# Patient Record
Sex: Male | Born: 1950 | Race: White | Hispanic: No | Marital: Married | Smoking: Former smoker
Health system: Southern US, Community
[De-identification: ages and names within clinical notes are randomized; demographics above are authoritative.]

## PROBLEM LIST (undated history)

## (undated) DIAGNOSIS — K5900 Constipation, unspecified: Secondary | ICD-10-CM

## (undated) DIAGNOSIS — A6920 Lyme disease, unspecified: Secondary | ICD-10-CM

## (undated) DIAGNOSIS — I10 Essential (primary) hypertension: Secondary | ICD-10-CM

## (undated) DIAGNOSIS — E039 Hypothyroidism, unspecified: Secondary | ICD-10-CM

## (undated) DIAGNOSIS — H9190 Unspecified hearing loss, unspecified ear: Secondary | ICD-10-CM

## (undated) DIAGNOSIS — M47816 Spondylosis without myelopathy or radiculopathy, lumbar region: Secondary | ICD-10-CM

## (undated) DIAGNOSIS — I4891 Unspecified atrial fibrillation: Secondary | ICD-10-CM

## (undated) DIAGNOSIS — G039 Meningitis, unspecified: Secondary | ICD-10-CM

## (undated) DIAGNOSIS — E785 Hyperlipidemia, unspecified: Secondary | ICD-10-CM

## (undated) DIAGNOSIS — G459 Transient cerebral ischemic attack, unspecified: Secondary | ICD-10-CM

## (undated) DIAGNOSIS — K219 Gastro-esophageal reflux disease without esophagitis: Secondary | ICD-10-CM

## (undated) DIAGNOSIS — C801 Malignant (primary) neoplasm, unspecified: Secondary | ICD-10-CM

## (undated) HISTORY — PX: COLONOSCOPY W/ BIOPSIES AND POLYPECTOMY: SHX1376

## (undated) HISTORY — PX: JOINT REPLACEMENT: SHX530

---

## 2018-08-16 ENCOUNTER — Other Ambulatory Visit: Payer: Self-pay

## 2018-08-16 ENCOUNTER — Encounter (HOSPITAL_COMMUNITY): Payer: Self-pay | Admitting: Emergency Medicine

## 2018-08-16 ENCOUNTER — Ambulatory Visit (HOSPITAL_COMMUNITY)
Admission: EM | Admit: 2018-08-16 | Discharge: 2018-08-16 | Disposition: A | Discharge status: Home / Self Care | Payer: Medicare PPO | Attending: Family Medicine | Admitting: Family Medicine

## 2018-08-16 DIAGNOSIS — J0100 Acute maxillary sinusitis, unspecified: Secondary | ICD-10-CM | POA: Insufficient documentation

## 2018-08-16 DIAGNOSIS — H04301 Unspecified dacryocystitis of right lacrimal passage: Secondary | ICD-10-CM | POA: Insufficient documentation

## 2018-08-16 HISTORY — DX: Meningitis, unspecified: G03.9

## 2018-08-16 HISTORY — DX: Lyme disease, unspecified: A69.20

## 2018-08-16 HISTORY — DX: Hyperlipidemia, unspecified: E78.5

## 2018-08-16 HISTORY — DX: Essential (primary) hypertension: I10

## 2018-08-16 MED ORDER — DOXYCYCLINE HYCLATE 100 MG PO CAPS: 100.0000 mg | ORAL_CAPSULE | Freq: Two times a day (BID) | ORAL | 0 refills | Status: DC

## 2018-08-16 MED ORDER — FLUTICASONE PROPIONATE 50 MCG/ACT NA SUSP: 2.0000 | Freq: Every day | NASAL | 0 refills | Status: DC

## 2018-08-16 MED ORDER — CETIRIZINE HCL 5 MG PO CHEW: 5.0000 mg | CHEWABLE_TABLET | Freq: Every day | ORAL | 0 refills | Status: DC

## 2018-08-16 NOTE — Discharge Instructions (Signed)
Get plenty of rest and push fluids Zyrtec prescribed for nasal congestion, runny nose, and/or sore throat Flonase prescribed for nasal congestion and runny nose Use medications daily for symptom relief Augmentin for possible sinus infection Use OTC medications like ibuprofen or tylenol as needed fever or pain Follow up with PCP or [facility] if symptoms persist Return or go to ER if you have any new or worsening symptoms fever, chills, nausea, vomiting, chest pain, cough, shortness of breath, wheezing, abdominal pain, changes in bowel or bladder habits, etc...  Eye drainage: Eye drainage most likely secondary to blocked tear duct Apply warm compresses to eye for 10-15 minutes a day approximately 4 times Also try to gently massage the tear duct throughout the day Return or follow up with eye doctor if symptoms persists despite treatment Return or go to the ED if you have any new or worsening symptoms such as pain, itching, increased redness, swelling, vision changes, etc..Marland Kitchen

## 2018-08-16 NOTE — ED Provider Notes (Signed)
[place]   [id number] 08/16/18 Arrival Time: 1448   CC: URI symptoms and eye drainage  SUBJECTIVE: History from: patient.  Alvin Chase is a 68 y.o. male who presents with nasal congestion, sinus pain/ pressure, sore throat, PND, and cough x 6 days.  Wife with similar symptoms.  Symptoms began after painting bedrooms.  Has tried delsym, aspirin, and benadryl with temporary relief.  Symptoms are made worse at night.  Denies similar symptoms in the past.  Complains of feeling hot/ cold, and fatigue.  Denies SOB, wheezing, chest pain, nausea, changes in bowel or bladder habits.    Patient also mentions RT eye drainage x 2 days.  Denies precipitating event or positive exposure to bacterial conjunctivitis.  Tried rinsing eye out without relief.  Worse in the AM.  Denies pain or itching.  Denis vision changes, FB sensation, or contact lens use.    Received flu shot this year: no.  ROS: As per HPI.  Past Medical History:  Diagnosis Date  . Hyperlipemia   . Hypertension   . Lyme disease   . Meningitis    Past Surgical History:  Procedure Laterality Date  . JOINT REPLACEMENT     Allergies  Allergen Reactions  . Amoxicillin Rash  . Plaquenil  [Hydroxychloroquine Sulfate] Rash   No current facility-administered medications on file prior to encounter.    Current Outpatient Medications on File Prior to Encounter  Medication Sig Dispense Refill  . gabapentin (NEURONTIN) 300 MG capsule gabapentin 300 mg capsule  Take 1 capsule 3 times a day by oral route.    Marland Kitchen levothyroxine (SYNTHROID, LEVOTHROID) 100 MCG tablet levothyroxine 100 mcg tablet  TAKE 1 TABLET BY MOUTH EVERY DAY    . simvastatin (ZOCOR) 20 MG tablet Take by mouth.    Marland Kitchen lisinopril (PRINIVIL,ZESTRIL) 5 MG tablet lisinopril 5 mg tablet     Social History   Socioeconomic History  . Marital status: Married    Spouse name: Not on file  . Number of children: Not on file  . Years of education: Not on file  .  Highest education level: Not on file  Occupational History  . Not on file  Social Needs  . Financial resource strain: Not on file  . Food insecurity:    Worry: Not on file    Inability: Not on file  . Transportation needs:    Medical: Not on file    Non-medical: Not on file  Tobacco Use  . Smoking status: Never Smoker  . Smokeless tobacco: Never Used  Substance and Sexual Activity  . Alcohol use: Yes    Frequency: Never  . Drug use: Never  . Sexual activity: Not on file  Lifestyle  . Physical activity:    Days per week: Not on file    Minutes per session: Not on file  . Stress: Not on file  Relationships  . Social connections:    Talks on phone: Not on file    Gets together: Not on file    Attends religious service: Not on file    Active member of club or organization: Not on file    Attends meetings of clubs or organizations: Not on file    Relationship status: Not on file  . Intimate partner violence:    Fear of current or ex partner: Not on file    Emotionally abused: Not on file    Physically abused: Not on file    Forced sexual activity: Not on file  Other Topics Concern  . Not on file  Social History Narrative  . Not on file   History reviewed. No pertinent family history.  OBJECTIVE:  Vitals:   08/16/18 1124  BP: 139/74  Pulse: 88  Temp: 99.2 F (37.3 C)  TempSrc: Temporal  SpO2: 99%     General appearance: alert; appears fatigued, but nontoxic; speaking in full sentences and tolerating own secretions HEENT: NCAT; Ears: EACs clear, TMs pearly gray; Eyes: PERRL.  EOM grossly intact. RT conjunctiva mildly injected, with purulent drainage from RT eye; mild surrounding periorbital swelling without erythema, NTTP (see pictures below); Sinuses: TTP; Nose: nares patent with mild rhinorrhea, Throat: oropharynx clear, tonsils non erythematous or enlarged, uvula midline  Neck: supple without LAD Lungs: unlabored respirations, symmetrical air entry; cough:  moderate; no respiratory distress; CTAB Heart: regular rate and rhythm.  Radial pulses 2+ symmetrical bilaterally Skin: warm and dry Psychological: alert and cooperative; normal mood and affect        ASSESSMENT & PLAN:  1. Acute non-recurrent maxillary sinusitis   2. Tear duct infection, right     Meds ordered this encounter  Medications  . fluticasone (FLONASE) 50 MCG/ACT nasal spray    Sig: Place 2 sprays into both nostrils daily.    Dispense:  16 g    Refill:  0    Order Specific Question:   Supervising Provider    Answer:   Raylene Everts [[id number]]  . doxycycline (VIBRAMYCIN) 100 MG capsule    Sig: Take 1 capsule (100 mg total) by mouth 2 (two) times daily.    Dispense:  20 capsule    Refill:  0    Order Specific Question:   Supervising Provider    Answer:   Raylene Everts [[id number]]  . cetirizine (ZYRTEC) 5 MG chewable tablet    Sig: Chew 1 tablet (5 mg total) by mouth daily.    Dispense:  20 tablet    Refill:  0    Order Specific Question:   Supervising Provider    Answer:   Raylene Everts [[id number]]    Get plenty of rest and push fluids Zyrtec prescribed for nasal congestion, runny nose, and/or sore throat Flonase prescribed for nasal congestion and runny nose Use medications daily for symptom relief Doxycycline for possible sinus infection Use OTC medications like ibuprofen or tylenol as needed fever or pain Follow up with PCP or [facility] if symptoms persist Return or go to ER if you have any new or worsening symptoms fever, chills, nausea, vomiting, chest pain, cough, shortness of breath, wheezing, abdominal pain, changes in bowel or bladder habits, etc...  Eye drainage: Eye drainage most likely secondary to blocked tear duct Apply warm compresses to eye for 10-15 minutes a day approximately 4 times Also try to gently massage the tear duct throughout the day Return or follow up with eye doctor if symptoms persists despite  treatment Return or go to the ED if you have any new or worsening symptoms such as pain, itching, increased redness, swelling, vision changes, etc...  Reviewed expectations re: course of current medical issues. Questions answered. Outlined signs and symptoms indicating need for more acute intervention. Patient verbalized understanding. After Visit Summary given.         Lestine Box, PA-C 08/16/18 1251

## 2018-08-16 NOTE — ED Notes (Signed)
Spoke to dr hagler, agreeable to seeing patient and spouse in room.  Spouse reports patient is hard of hearing.

## 2018-08-16 NOTE — ED Triage Notes (Addendum)
Pt reports a cough and PND that started last Thursday night.  Pt woke up this morning with right eye drainage.  He is also having some tightness and SOB in his chest from the cough.  Pt is here with his wife who is having similar symptoms.  She reports they all started after they painted the ceiling in their new house last Thursday.

## 2019-11-16 ENCOUNTER — Other Ambulatory Visit: Payer: Self-pay | Admitting: Neurosurgery

## 2019-11-21 ENCOUNTER — Other Ambulatory Visit: Payer: Self-pay | Admitting: Neurosurgery

## 2019-11-23 NOTE — Pre-Procedure Instructions (Addendum)
   COHEN NOVACEK  11/23/2019     Your procedure is scheduled on Thursday, November 29, 2019  Report to [facility] Admitting at 10:55 A.M.  Call this number if you have problems the morning of surgery:  [phone]   Remember:  Do not eat or drink after midnight the night before surgery.    Take these medicines the morning of surgery with A SIP OF WATER :  levothyroxine (SYNTHROID, LEVOTHROID)  gabapentin (NEURONTIN) If needed: pain medication Tylenol  ( OR ) oxyCODONE-acetaminophen (PERCOCET)  Stop taking Aspirin (unless otherwise advised by surgeon), vitamins, fish oil, and herbal medications. Do not take any NSAIDs ie: Ibuprofen, Advil, Naproxen (Aleve), Motrin, BC and Goody Powder; stop now.   Do not wear jewelry  Do not wear lotions, powders, or perfumes, or deodorant.  Do not shave 48 hours prior to surgery.  Men may shave face and neck.  Do not bring valuables to the hospital.  [facility] is not responsible for any belongings or valuables.  Contacts, dentures or bridgework may not be worn into surgery.  Leave your suitcase in the car.  After surgery it may be brought to your room. For patients admitted to the hospital, discharge time will be determined by your treatment team.  Patients discharged the day of surgery will not be allowed to drive home.   Special instructions: See " [facility] Preparing For Surgery " sheet.   Please read over the following fact sheets that you were given. Pain Booklet, Coughing and Deep Breathing and Surgical Site Infection Prevention

## 2019-11-26 ENCOUNTER — Encounter (HOSPITAL_COMMUNITY)
Admission: RE | Admit: 2019-11-26 | Discharge: 2019-11-26 | Disposition: A | Discharge status: Home / Self Care | Payer: Medicare PPO | Source: Ambulatory Visit | Attending: Neurosurgery | Admitting: Neurosurgery

## 2019-11-26 ENCOUNTER — Encounter (HOSPITAL_COMMUNITY): Payer: Self-pay

## 2019-11-26 ENCOUNTER — Other Ambulatory Visit: Payer: Self-pay

## 2019-11-26 ENCOUNTER — Other Ambulatory Visit (HOSPITAL_COMMUNITY)
Admission: RE | Admit: 2019-11-26 | Discharge: 2019-11-26 | Disposition: A | Discharge status: Home / Self Care | Payer: Medicare PPO | Source: Ambulatory Visit | Attending: Neurosurgery | Admitting: Neurosurgery

## 2019-11-26 DIAGNOSIS — I10 Essential (primary) hypertension: Secondary | ICD-10-CM | POA: Insufficient documentation

## 2019-11-26 DIAGNOSIS — Z01818 Encounter for other preprocedural examination: Secondary | ICD-10-CM | POA: Insufficient documentation

## 2019-11-26 HISTORY — DX: Unspecified atrial fibrillation: I48.91

## 2019-11-26 HISTORY — DX: Unspecified hearing loss, unspecified ear: H91.90

## 2019-11-26 HISTORY — DX: Hypothyroidism, unspecified: E03.9

## 2019-11-26 HISTORY — DX: Malignant (primary) neoplasm, unspecified: C80.1

## 2019-11-26 HISTORY — DX: Transient cerebral ischemic attack, unspecified: G45.9

## 2019-11-26 HISTORY — DX: Constipation, unspecified: K59.00

## 2019-11-26 HISTORY — DX: Spondylosis without myelopathy or radiculopathy, lumbar region: M47.816

## 2019-11-26 HISTORY — DX: Gastro-esophageal reflux disease without esophagitis: K21.9

## 2019-11-26 LAB — CBC
HCT: 45.3 % (ref 39.0–52.0)
Hemoglobin: 14.9 g/dL (ref 13.0–17.0)
MCH: 30.7 pg (ref 26.0–34.0)
MCHC: 32.9 g/dL (ref 30.0–36.0)
MCV: 93.4 fL (ref 80.0–100.0)
Platelets: 246 10*3/uL (ref 150–400)
RBC: 4.85 MIL/uL (ref 4.22–5.81)
RDW: 14 % (ref 11.5–15.5)
WBC: 5.6 10*3/uL (ref 4.0–10.5)
nRBC: 0 % (ref 0.0–0.2)

## 2019-11-26 LAB — SURGICAL PCR SCREEN
MRSA, PCR: NEGATIVE
Staphylococcus aureus: NEGATIVE

## 2019-11-26 LAB — BASIC METABOLIC PANEL
Anion gap: 9 (ref 5–15)
BUN: 19 mg/dL (ref 8–23)
CO2: 25 mmol/L (ref 22–32)
Calcium: 8.5 mg/dL — ABNORMAL LOW (ref 8.9–10.3)
Chloride: 104 mmol/L (ref 98–111)
Creatinine, Ser: 1.11 mg/dL (ref 0.61–1.24)
GFR calc Af Amer: >60 mL/min (ref >60)
GFR calc non Af Amer: >60 mL/min (ref >60)
Glucose, Bld: 106 mg/dL — ABNORMAL HIGH (ref 70–99)
Potassium: 4.2 mmol/L (ref 3.5–5.1)
Sodium: 138 mmol/L (ref 135–145)

## 2019-11-26 LAB — TYPE AND SCREEN
ABO/RH(D): A POS
Antibody Screen: NEGATIVE

## 2019-11-26 LAB — SARS CORONAVIRUS 2 (TAT 6-24 HRS): SARS Coronavirus 2: NEGATIVE

## 2019-11-26 LAB — ABO/RH: ABO/RH(D): A POS

## 2019-11-26 NOTE — Progress Notes (Signed)
   11/26/19 1130  OBSTRUCTIVE SLEEP APNEA  Have you ever been diagnosed with sleep apnea through a sleep study? No  Do you snore loudly (loud enough to be heard through closed doors)?  0  Do you often feel tired, fatigued, or sleepy during the daytime (such as falling asleep during driving or talking to someone)? 0  Has anyone observed you stop breathing during your sleep? 1  Do you have, or are you being treated for high blood pressure? 1  BMI more than 35 kg/m2? 0  Age > 50 (1-yes) 1  Neck circumference greater than:Male 16 inches or larger, Male 17inches or larger? 1  Male Gender (Yes=1) 1  Obstructive Sleep Apnea Score 5

## 2019-11-26 NOTE — Progress Notes (Signed)
Pt HOH and does not wear hearing aids. Pt accompanied to PAT appointment with spouse, Katrina. Pt denies SOB, chest pain, and being under the care of a cardiologist. Pt PCP is Dr. Elvina Mattes. Pt denies having a stress test, echo and cardiac cath. Pt denies having an EKG and chest x ray in the last year. Pt denies recent labs. Spouse reminded to have pt quarantine. Spouse verbalized understanding of all pre-op instructions.

## 2019-11-29 ENCOUNTER — Inpatient Hospital Stay (HOSPITAL_COMMUNITY): Payer: Medicare PPO

## 2019-11-29 ENCOUNTER — Encounter (HOSPITAL_COMMUNITY): Payer: Self-pay | Admitting: Neurosurgery

## 2019-11-29 ENCOUNTER — Encounter (HOSPITAL_COMMUNITY)
Admission: RE | Disposition: A | Discharge status: Home / Self Care | Payer: Self-pay | Source: Home / Self Care | Attending: Neurosurgery

## 2019-11-29 ENCOUNTER — Inpatient Hospital Stay (HOSPITAL_COMMUNITY): Payer: Medicare PPO | Admitting: Anesthesiology

## 2019-11-29 ENCOUNTER — Other Ambulatory Visit: Payer: Self-pay

## 2019-11-29 ENCOUNTER — Inpatient Hospital Stay (HOSPITAL_COMMUNITY)
Admission: RE | Admit: 2019-11-29 | Discharge: 2019-11-30 | DRG: 455 | Disposition: A | Discharge status: Home / Self Care | Payer: Medicare PPO | Attending: Neurosurgery | Admitting: Neurosurgery

## 2019-11-29 DIAGNOSIS — M5116 Intervertebral disc disorders with radiculopathy, lumbar region: Secondary | ICD-10-CM | POA: Diagnosis present

## 2019-11-29 DIAGNOSIS — Z79899 Other long term (current) drug therapy: Secondary | ICD-10-CM

## 2019-11-29 DIAGNOSIS — Z881 Allergy status to other antibiotic agents status: Secondary | ICD-10-CM | POA: Diagnosis not present

## 2019-11-29 DIAGNOSIS — Z8673 Personal history of transient ischemic attack (TIA), and cerebral infarction without residual deficits: Secondary | ICD-10-CM | POA: Diagnosis not present

## 2019-11-29 DIAGNOSIS — E785 Hyperlipidemia, unspecified: Secondary | ICD-10-CM | POA: Diagnosis present

## 2019-11-29 DIAGNOSIS — Z419 Encounter for procedure for purposes other than remedying health state, unspecified: Secondary | ICD-10-CM

## 2019-11-29 DIAGNOSIS — Z79890 Hormone replacement therapy (postmenopausal): Secondary | ICD-10-CM | POA: Diagnosis not present

## 2019-11-29 DIAGNOSIS — Z888 Allergy status to other drugs, medicaments and biological substances status: Secondary | ICD-10-CM

## 2019-11-29 DIAGNOSIS — K219 Gastro-esophageal reflux disease without esophagitis: Secondary | ICD-10-CM | POA: Diagnosis present

## 2019-11-29 DIAGNOSIS — Z885 Allergy status to narcotic agent status: Secondary | ICD-10-CM | POA: Diagnosis not present

## 2019-11-29 DIAGNOSIS — I10 Essential (primary) hypertension: Secondary | ICD-10-CM | POA: Diagnosis present

## 2019-11-29 DIAGNOSIS — M4316 Spondylolisthesis, lumbar region: Principal | ICD-10-CM | POA: Diagnosis present

## 2019-11-29 DIAGNOSIS — H9190 Unspecified hearing loss, unspecified ear: Secondary | ICD-10-CM | POA: Diagnosis present

## 2019-11-29 DIAGNOSIS — K5900 Constipation, unspecified: Secondary | ICD-10-CM | POA: Diagnosis present

## 2019-11-29 DIAGNOSIS — G8929 Other chronic pain: Secondary | ICD-10-CM | POA: Diagnosis present

## 2019-11-29 DIAGNOSIS — M48062 Spinal stenosis, lumbar region with neurogenic claudication: Secondary | ICD-10-CM | POA: Diagnosis present

## 2019-11-29 DIAGNOSIS — M4726 Other spondylosis with radiculopathy, lumbar region: Secondary | ICD-10-CM | POA: Diagnosis present

## 2019-11-29 DIAGNOSIS — Z87891 Personal history of nicotine dependence: Secondary | ICD-10-CM | POA: Diagnosis not present

## 2019-11-29 DIAGNOSIS — E039 Hypothyroidism, unspecified: Secondary | ICD-10-CM | POA: Diagnosis present

## 2019-11-29 DIAGNOSIS — Z20822 Contact with and (suspected) exposure to covid-19: Secondary | ICD-10-CM | POA: Diagnosis present

## 2019-11-29 SURGERY — POSTERIOR LUMBAR FUSION 1 LEVEL
Anesthesia: General

## 2019-11-29 MED ORDER — THROMBIN 5000 UNITS EX SOLR
CUTANEOUS | Status: AC
  Filled 2019-11-29: qty 5000

## 2019-11-29 MED ORDER — BUPIVACAINE LIPOSOME 1.3 % IJ SUSP
20.0000 mL | Freq: Once | INTRAMUSCULAR | Status: DC
  Filled 2019-11-29: qty 20

## 2019-11-29 MED ORDER — FENTANYL CITRATE (PF) 100 MCG/2ML IJ SOLN
INTRAMUSCULAR | Status: AC
  Filled 2019-11-29: qty 2

## 2019-11-29 MED ORDER — FENTANYL CITRATE (PF) 250 MCG/5ML IJ SOLN
INTRAMUSCULAR | Status: DC | PRN
  Administered 2019-11-29: 100 ug via INTRAVENOUS
  Administered 2019-11-29 (×3): 50 ug via INTRAVENOUS

## 2019-11-29 MED ORDER — PROPOFOL 10 MG/ML IV BOLUS
INTRAVENOUS | Status: AC
  Filled 2019-11-29: qty 20

## 2019-11-29 MED ORDER — LIDOCAINE-EPINEPHRINE 1 %-1:100000 IJ SOLN
INTRAMUSCULAR | Status: AC
  Filled 2019-11-29: qty 1

## 2019-11-29 MED ORDER — FENTANYL CITRATE (PF) 250 MCG/5ML IJ SOLN
INTRAMUSCULAR | Status: AC
  Filled 2019-11-29: qty 5

## 2019-11-29 MED ORDER — OXYCODONE HCL 5 MG PO TABS
5.0000 mg | ORAL_TABLET | Freq: Once | ORAL | Status: AC | PRN
  Administered 2019-11-29: 5 mg via ORAL

## 2019-11-29 MED ORDER — BUPIVACAINE LIPOSOME 1.3 % IJ SUSP
INTRAMUSCULAR | Status: DC | PRN
  Administered 2019-11-29: 20 mL

## 2019-11-29 MED ORDER — VANCOMYCIN HCL IN DEXTROSE 750-5 MG/150ML-% IV SOLN
750.0000 mg | Freq: Once | INTRAVENOUS | Status: AC
  Administered 2019-11-29: 750 mg via INTRAVENOUS
  Filled 2019-11-29: qty 150

## 2019-11-29 MED ORDER — VANCOMYCIN HCL IN DEXTROSE 1-5 GM/200ML-% IV SOLN: 1000.0000 mg | INTRAVENOUS | Status: AC

## 2019-11-29 MED ORDER — SODIUM CHLORIDE 0.9% FLUSH: 3.0000 mL | INTRAVENOUS | Status: DC | PRN

## 2019-11-29 MED ORDER — OXYCODONE HCL 5 MG PO TABS
ORAL_TABLET | ORAL | Status: AC
  Filled 2019-11-29: qty 1

## 2019-11-29 MED ORDER — GLYCOPYRROLATE PF 0.2 MG/ML IJ SOSY
PREFILLED_SYRINGE | INTRAMUSCULAR | Status: AC
  Filled 2019-11-29: qty 2

## 2019-11-29 MED ORDER — CHLORHEXIDINE GLUCONATE CLOTH 2 % EX PADS: 6.0000 | MEDICATED_PAD | Freq: Once | CUTANEOUS | Status: DC

## 2019-11-29 MED ORDER — SODIUM CHLORIDE 0.9% FLUSH: 3.0000 mL | Freq: Two times a day (BID) | INTRAVENOUS | Status: DC

## 2019-11-29 MED ORDER — LIDOCAINE-EPINEPHRINE 1 %-1:100000 IJ SOLN
INTRAMUSCULAR | Status: DC | PRN
  Administered 2019-11-29: 10 mL

## 2019-11-29 MED ORDER — OXYCODONE HCL 5 MG/5ML PO SOLN: 5.0000 mg | Freq: Once | ORAL | Status: AC | PRN

## 2019-11-29 MED ORDER — ROCURONIUM BROMIDE 10 MG/ML (PF) SYRINGE
PREFILLED_SYRINGE | INTRAVENOUS | Status: DC | PRN
  Administered 2019-11-29 (×2): 20 mg via INTRAVENOUS
  Administered 2019-11-29: 50 mg via INTRAVENOUS
  Administered 2019-11-29: 30 mg via INTRAVENOUS

## 2019-11-29 MED ORDER — MENTHOL 3 MG MT LOZG: 1.0000 | LOZENGE | OROMUCOSAL | Status: DC | PRN

## 2019-11-29 MED ORDER — ACETAMINOPHEN 500 MG PO TABS
1000.0000 mg | ORAL_TABLET | Freq: Four times a day (QID) | ORAL | Status: AC
  Administered 2019-11-29 – 2019-11-30 (×4): 1000 mg via ORAL
  Filled 2019-11-29 (×4): qty 2

## 2019-11-29 MED ORDER — BACITRACIN ZINC 500 UNIT/GM EX OINT
TOPICAL_OINTMENT | CUTANEOUS | Status: AC
  Filled 2019-11-29: qty 28.35

## 2019-11-29 MED ORDER — LISINOPRIL 10 MG PO TABS: 5.0000 mg | ORAL_TABLET | Freq: Every day | ORAL | Status: DC | PRN

## 2019-11-29 MED ORDER — GABAPENTIN 300 MG PO CAPS
600.0000 mg | ORAL_CAPSULE | Freq: Three times a day (TID) | ORAL | Status: DC
  Administered 2019-11-29 – 2019-11-30 (×2): 600 mg via ORAL
  Filled 2019-11-29 (×2): qty 2

## 2019-11-29 MED ORDER — ONDANSETRON HCL 4 MG/2ML IJ SOLN: 4.0000 mg | Freq: Once | INTRAMUSCULAR | Status: DC | PRN

## 2019-11-29 MED ORDER — THROMBIN 5000 UNITS EX SOLR
OROMUCOSAL | Status: DC | PRN
  Administered 2019-11-29: 14:00:00 5 mL via TOPICAL

## 2019-11-29 MED ORDER — CYCLOBENZAPRINE HCL 10 MG PO TABS
10.0000 mg | ORAL_TABLET | Freq: Three times a day (TID) | ORAL | Status: DC | PRN
  Administered 2019-11-29 – 2019-11-30 (×2): 10 mg via ORAL
  Filled 2019-11-29: qty 1

## 2019-11-29 MED ORDER — MIDAZOLAM HCL 2 MG/2ML IJ SOLN
INTRAMUSCULAR | Status: AC
  Filled 2019-11-29: qty 2

## 2019-11-29 MED ORDER — LACTATED RINGERS IV SOLN: INTRAVENOUS | Status: DC

## 2019-11-29 MED ORDER — MORPHINE SULFATE (PF) 4 MG/ML IV SOLN: 4.0000 mg | INTRAVENOUS | Status: DC | PRN

## 2019-11-29 MED ORDER — BISACODYL 10 MG RE SUPP: 10.0000 mg | Freq: Every day | RECTAL | Status: DC | PRN

## 2019-11-29 MED ORDER — VANCOMYCIN HCL IN DEXTROSE 1-5 GM/200ML-% IV SOLN
INTRAVENOUS | Status: AC
  Administered 2019-11-29: 1000 mg via INTRAVENOUS
  Filled 2019-11-29: qty 200

## 2019-11-29 MED ORDER — ACETAMINOPHEN 325 MG PO TABS: 650.0000 mg | ORAL_TABLET | ORAL | Status: DC | PRN

## 2019-11-29 MED ORDER — DEXAMETHASONE SODIUM PHOSPHATE 10 MG/ML IJ SOLN
INTRAMUSCULAR | Status: DC | PRN
  Administered 2019-11-29: 8 mg via INTRAVENOUS

## 2019-11-29 MED ORDER — OXYCODONE HCL 5 MG PO TABS
10.0000 mg | ORAL_TABLET | ORAL | Status: DC | PRN
  Administered 2019-11-29 – 2019-11-30 (×5): 10 mg via ORAL
  Filled 2019-11-29 (×5): qty 2

## 2019-11-29 MED ORDER — BACITRACIN ZINC 500 UNIT/GM EX OINT
TOPICAL_OINTMENT | CUTANEOUS | Status: DC | PRN
  Administered 2019-11-29: 1 via TOPICAL

## 2019-11-29 MED ORDER — NEOSTIGMINE METHYLSULFATE 3 MG/3ML IV SOSY
PREFILLED_SYRINGE | INTRAVENOUS | Status: AC
  Filled 2019-11-29: qty 3

## 2019-11-29 MED ORDER — LIDOCAINE 2% (20 MG/ML) 5 ML SYRINGE
INTRAMUSCULAR | Status: DC | PRN
  Administered 2019-11-29: 60 mg via INTRAVENOUS

## 2019-11-29 MED ORDER — LEVOTHYROXINE SODIUM 100 MCG PO TABS: 100.0000 ug | ORAL_TABLET | Freq: Every day | ORAL | Status: DC

## 2019-11-29 MED ORDER — DOCUSATE SODIUM 100 MG PO CAPS
100.0000 mg | ORAL_CAPSULE | Freq: Two times a day (BID) | ORAL | Status: DC
  Administered 2019-11-29 – 2019-11-30 (×2): 100 mg via ORAL
  Filled 2019-11-29 (×2): qty 1

## 2019-11-29 MED ORDER — ACETAMINOPHEN 500 MG PO TABS
ORAL_TABLET | ORAL | Status: AC
  Filled 2019-11-29: qty 2

## 2019-11-29 MED ORDER — SODIUM CHLORIDE 0.9 % IV SOLN
INTRAVENOUS | Status: DC | PRN
  Administered 2019-11-29: 500 mL

## 2019-11-29 MED ORDER — FENTANYL CITRATE (PF) 100 MCG/2ML IJ SOLN
25.0000 ug | INTRAMUSCULAR | Status: DC | PRN
  Administered 2019-11-29: 25 ug via INTRAVENOUS
  Administered 2019-11-29: 50 ug via INTRAVENOUS
  Administered 2019-11-29: 25 ug via INTRAVENOUS

## 2019-11-29 MED ORDER — OXYCODONE-ACETAMINOPHEN 10-325 MG PO TABS: 1.0000 | ORAL_TABLET | ORAL | Status: DC | PRN

## 2019-11-29 MED ORDER — SIMVASTATIN 20 MG PO TABS
20.0000 mg | ORAL_TABLET | Freq: Every day | ORAL | Status: DC
  Administered 2019-11-29 – 2019-11-30 (×2): 20 mg via ORAL
  Filled 2019-11-29 (×2): qty 1

## 2019-11-29 MED ORDER — 0.9 % SODIUM CHLORIDE (POUR BTL) OPTIME
TOPICAL | Status: DC | PRN
  Administered 2019-11-29: 1000 mL

## 2019-11-29 MED ORDER — PHENOL 1.4 % MT LIQD: 1.0000 | OROMUCOSAL | Status: DC | PRN

## 2019-11-29 MED ORDER — ACETAMINOPHEN 650 MG RE SUPP: 650.0000 mg | RECTAL | Status: DC | PRN

## 2019-11-29 MED ORDER — ONDANSETRON HCL 4 MG/2ML IJ SOLN
INTRAMUSCULAR | Status: DC | PRN
  Administered 2019-11-29: 4 mg via INTRAVENOUS

## 2019-11-29 MED ORDER — SODIUM CHLORIDE 0.9 % IV SOLN: 250.0000 mL | INTRAVENOUS | Status: DC

## 2019-11-29 MED ORDER — OXYCODONE HCL 5 MG PO TABS
5.0000 mg | ORAL_TABLET | ORAL | Status: DC | PRN
  Administered 2019-11-29: 5 mg via ORAL
  Filled 2019-11-29: qty 1

## 2019-11-29 MED ORDER — ONDANSETRON HCL 4 MG/2ML IJ SOLN: 4.0000 mg | Freq: Four times a day (QID) | INTRAMUSCULAR | Status: DC | PRN

## 2019-11-29 MED ORDER — PHENYLEPHRINE HCL-NACL 10-0.9 MG/250ML-% IV SOLN
INTRAVENOUS | Status: DC | PRN
  Administered 2019-11-29: 50 ug/min via INTRAVENOUS

## 2019-11-29 MED ORDER — ONDANSETRON HCL 4 MG PO TABS: 4.0000 mg | ORAL_TABLET | Freq: Four times a day (QID) | ORAL | Status: DC | PRN

## 2019-11-29 MED ORDER — PHENYLEPHRINE 40 MCG/ML (10ML) SYRINGE FOR IV PUSH (FOR BLOOD PRESSURE SUPPORT)
PREFILLED_SYRINGE | INTRAVENOUS | Status: DC | PRN
  Administered 2019-11-29: 80 ug via INTRAVENOUS
  Administered 2019-11-29: 120 ug via INTRAVENOUS
  Administered 2019-11-29 (×2): 40 ug via INTRAVENOUS

## 2019-11-29 MED ORDER — PROPOFOL 10 MG/ML IV BOLUS
INTRAVENOUS | Status: DC | PRN
  Administered 2019-11-29: 150 mg via INTRAVENOUS

## 2019-11-29 MED ORDER — ACETAMINOPHEN 500 MG PO TABS: 1000.0000 mg | ORAL_TABLET | Freq: Once | ORAL | Status: DC

## 2019-11-29 MED ORDER — CYCLOBENZAPRINE HCL 10 MG PO TABS
ORAL_TABLET | ORAL | Status: AC
  Filled 2019-11-29: qty 1

## 2019-11-29 SURGICAL SUPPLY — 65 items
BAG DECANTER FOR FLEXI CONT (MISCELLANEOUS) ×3 IMPLANT
BENZOIN TINCTURE PRP APPL 2/3 (GAUZE/BANDAGES/DRESSINGS) ×3 IMPLANT
BLADE CLIPPER SURG (BLADE) IMPLANT
BUR MATCHSTICK NEURO 3.0 LAGG (BURR) ×3 IMPLANT
BUR PRECISION FLUTE 6.0 (BURR) ×3 IMPLANT
CAGE ALTERA 10X31X9-13 15D (Cage) ×2 IMPLANT
CAGE ALTERA 9-13-15-31MM (Cage) ×1 IMPLANT
CANISTER SUCT 3000ML PPV (MISCELLANEOUS) ×3 IMPLANT
CAP LOCK DLX THRD (Cap) ×12 IMPLANT
CARTRIDGE OIL MAESTRO DRILL (MISCELLANEOUS) ×1 IMPLANT
CLOSURE WOUND 1/2 X4 (GAUZE/BANDAGES/DRESSINGS) ×1
CNTNR URN SCR LID CUP LEK RST (MISCELLANEOUS) ×1 IMPLANT
CONT SPEC 4OZ STRL OR WHT (MISCELLANEOUS) ×2
COVER BACK TABLE 60X90IN (DRAPES) ×3 IMPLANT
COVER WAND RF STERILE (DRAPES) IMPLANT
DECANTER SPIKE VIAL GLASS SM (MISCELLANEOUS) ×3 IMPLANT
DIFFUSER DRILL AIR PNEUMATIC (MISCELLANEOUS) ×3 IMPLANT
DRAPE C-ARM 42X72 X-RAY (DRAPES) ×6 IMPLANT
DRAPE HALF SHEET 40X57 (DRAPES) ×3 IMPLANT
DRAPE LAPAROTOMY 100X72X124 (DRAPES) ×3 IMPLANT
DRAPE SURG 17X23 STRL (DRAPES) ×12 IMPLANT
DRSG OPSITE POSTOP 4X6 (GAUZE/BANDAGES/DRESSINGS) ×3 IMPLANT
ELECT BLADE 4.0 EZ CLEAN MEGAD (MISCELLANEOUS) ×6
ELECT REM PT RETURN 9FT ADLT (ELECTROSURGICAL) ×3
ELECTRODE BLDE 4.0 EZ CLN MEGD (MISCELLANEOUS) ×2 IMPLANT
ELECTRODE REM PT RTRN 9FT ADLT (ELECTROSURGICAL) ×1 IMPLANT
EVACUATOR 1/8 PVC DRAIN (DRAIN) IMPLANT
GAUZE 4X4 16PLY RFD (DISPOSABLE) ×3 IMPLANT
GAUZE SPONGE 4X4 12PLY STRL (GAUZE/BANDAGES/DRESSINGS) ×3 IMPLANT
GLOVE BIO SURGEON STRL SZ8 (GLOVE) ×6 IMPLANT
GLOVE BIO SURGEON STRL SZ8.5 (GLOVE) ×6 IMPLANT
GLOVE EXAM NITRILE XL STR (GLOVE) IMPLANT
GOWN STRL REUS W/ TWL LRG LVL3 (GOWN DISPOSABLE) IMPLANT
GOWN STRL REUS W/ TWL XL LVL3 (GOWN DISPOSABLE) ×2 IMPLANT
GOWN STRL REUS W/TWL 2XL LVL3 (GOWN DISPOSABLE) IMPLANT
GOWN STRL REUS W/TWL LRG LVL3 (GOWN DISPOSABLE)
GOWN STRL REUS W/TWL XL LVL3 (GOWN DISPOSABLE) ×4
HEMOSTAT POWDER KIT SURGIFOAM (HEMOSTASIS) ×3 IMPLANT
KIT BASIN OR (CUSTOM PROCEDURE TRAY) ×3 IMPLANT
KIT TURNOVER KIT B (KITS) ×3 IMPLANT
MILL MEDIUM DISP (BLADE) ×3 IMPLANT
NEEDLE HYPO 21X1.5 SAFETY (NEEDLE) ×3 IMPLANT
NEEDLE HYPO 22GX1.5 SAFETY (NEEDLE) ×3 IMPLANT
NS IRRIG 1000ML POUR BTL (IV SOLUTION) ×3 IMPLANT
OIL CARTRIDGE MAESTRO DRILL (MISCELLANEOUS) ×3
PACK LAMINECTOMY NEURO (CUSTOM PROCEDURE TRAY) ×3 IMPLANT
PAD ARMBOARD 7.5X6 YLW CONV (MISCELLANEOUS) ×9 IMPLANT
PATTIES SURGICAL .5 X1 (DISPOSABLE) IMPLANT
PUTTY DBM 10CC CALC GRAN (Putty) ×3 IMPLANT
ROD CREO DLX CVD 6.35X40 (Rod) ×2 IMPLANT
ROD CURVED TI 6.35X40 (Rod) ×4 IMPLANT
SCREW PA DLX CREO 7.5X50 (Screw) ×6 IMPLANT
SCREW PA DLX CREO 7.5X55 (Screw) ×6 IMPLANT
SPONGE LAP 4X18 RFD (DISPOSABLE) IMPLANT
SPONGE NEURO XRAY DETECT 1X3 (DISPOSABLE) IMPLANT
SPONGE SURGIFOAM ABS GEL 100 (HEMOSTASIS) IMPLANT
STRIP CLOSURE SKIN 1/2X4 (GAUZE/BANDAGES/DRESSINGS) ×2 IMPLANT
SUT VIC AB 1 CT1 18XBRD ANBCTR (SUTURE) ×2 IMPLANT
SUT VIC AB 1 CT1 8-18 (SUTURE) ×4
SUT VIC AB 2-0 CP2 18 (SUTURE) ×9 IMPLANT
SYR 20ML LL LF (SYRINGE) IMPLANT
TOWEL GREEN STERILE (TOWEL DISPOSABLE) ×3 IMPLANT
TOWEL GREEN STERILE FF (TOWEL DISPOSABLE) ×3 IMPLANT
TRAY FOLEY MTR SLVR 16FR STAT (SET/KITS/TRAYS/PACK) ×3 IMPLANT
WATER STERILE IRR 1000ML POUR (IV SOLUTION) ×3 IMPLANT

## 2019-11-29 NOTE — Op Note (Signed)
Brief history: The patient is a 69 year old white male who has had chronic back and leg pain.  He developed sudden severe left leg pain.  He failed medical management.  He was worked up with a lumbar MRI which demonstrated an L4-5 spinal stenosis and a left L2-3 herniated disc.  I discussed the various treatment options.  He has decided to proceed with surgery after weighing the risks, benefits and alternatives.  Preoperative diagnosis: L4-5 spondylolisthesis, degenerative disc disease, spinal stenosis compressing both the L4 and the L5 nerve roots; left L2-3 herniated disc; lumbago; lumbar radiculopathy; neurogenic claudication  Postoperative diagnosis: The same  Procedure: Bilateral L4-5 laminotomy/foraminotomies/medial facetectomy to decompress the bilateral L4 and L5 nerve roots(the work required to do this was in addition to the work required to do the posterior lumbar interbody fusion because of the patient's spinal stenosis, facet arthropathy. Etc. requiring a wide decompression of the nerve roots.);  L4-5 transforaminal lumbar interbody fusion with local morselized autograft bone and Zimmer DBM; insertion of interbody prosthesis at L4-5 (globus peek expandable interbody prosthesis); posterior nonsegmental instrumentation from L4 to L5 with globus titanium pedicle screws and rods; posterior lateral arthrodesis at L4-5 with local morselized autograft bone and Zimmer DBM; left L2-3 discectomy.  Surgeon: Dr. Earle Gell  Asst.: Dr. Lorin Glass  Anesthesia: Gen. endotracheal  Estimated blood loss: 250 cc  Drains: None  Complications: None  Description of procedure: The patient was brought to the operating room by the anesthesia team. General endotracheal anesthesia was induced. The patient was turned to the prone position on the Wilson frame. The patient's lumbosacral region was then prepared with Betadine scrub and Betadine solution. Sterile drapes were applied.  I then injected the area  to be incised with Marcaine with epinephrine solution. I then used the scalpel to make a linear midline incision over the L2-3, L3-4 and L4-5 interspace. I then used electrocautery to perform a bilateral subperiosteal dissection exposing the spinous process and lamina of L2, L3, L4 and L5. We then obtained intraoperative radiograph to confirm our location. We then inserted the Verstrac retractor to provide exposure.  We began with a discectomy.  I performed a left L2-3 laminotomy with a high-speed drill.  I remove the left L2-3 ligamentum flavum with the Kerrison punch.  I performed a foraminotomy about the left L3 nerve root.  We carefully freed up the thecal sac and gently retracted the thecal sac and nerve root medially which demonstrated a herniated disc at L2-3.  We removed the herniated disks with a pituitary forceps.  We inspected the annulus.  There is no large holes in the annulus nor impending herniations so we did not perform an intervertebral discectomy.  I began the decompression by using the high speed drill to perform laminotomies at L4-5 bilaterally. We then used the Kerrison punches to widen the laminotomy and removed the ligamentum flavum at L4-5 bilaterally. We used the Kerrison punches to remove the medial facets at L4-5 bilaterally. We performed wide foraminotomies about the bilateral 4 and L5 nerve roots completing the decompression.  We now turned our attention to the posterior lumbar interbody fusion. I used a scalpel to incise the intervertebral disc at L4-5 bilaterally. I then performed a partial intervertebral discectomy at L4-5 bilaterally using the pituitary forceps. We prepared the vertebral endplates at 075-GRM for the fusion by removing the soft tissues with the curettes. We then used the trial spacers to pick the appropriate sized interbody prosthesis. We prefilled his prosthesis with a combination  of local morselized autograft bone that we obtained during the decompression as  well as Zimmer DBM. We inserted the prefilled prosthesis into the interspace at L4-5 from the left, we then turned and expanded the prosthesis. There was a good snug fit of the prosthesis in the interspace. We then filled and the remainder of the intervertebral disc space with local morselized autograft bone and Zimmer DBM. This completed the posterior lumbar interbody arthrodesis.  During the decompression and insertion of the prosthesis the assistant protected the thecal sac and nerve roots with the D'Errico retractor.  We now turned attention to the instrumentation. Under fluoroscopic guidance we cannulated the bilateral L4 and L5 pedicles with the bone probe. We then removed the bone probe. We then tapped the pedicle with a 6.5 millimeter tap. We then removed the tap. We probed inside the tapped pedicle with a ball probe to rule out cortical breaches. We then inserted a 7.5 x 50 and 55 millimeter pedicle screw into the L4 and L5 pedicles bilaterally under fluoroscopic guidance. We then palpated along the medial aspect of the pedicles to rule out cortical breaches. There were none. The nerve roots were not injured. We then connected the unilateral pedicle screws with a lordotic rod. We compressed the construct and secured the rod in place with the caps. We then tightened the caps appropriately. This completed the instrumentation from L4-5 bilaterally.  We now turned our attention to the posterior lateral arthrodesis at L4-5 bilaterally. We used the high-speed drill to decorticate the remainder of the facets, pars, transverse process at L4-5 bilaterally. We then applied a combination of local morselized autograft bone and Zimmer DBM over these decorticated posterior lateral structures. This completed the posterior lateral arthrodesis.  We then obtained hemostasis using bipolar electrocautery. We irrigated the wound out with bacitracin solution. We inspected the thecal sac and nerve roots and noted they were  well decompressed. We then removed the retractor.  We injected Exparel . We reapproximated patient's thoracolumbar fascia with interrupted #1 Vicryl suture. We reapproximated patient's subcutaneous tissue with interrupted 2-0 Vicryl suture. The reapproximated patient's skin with Steri-Strips and benzoin. The wound was then coated with bacitracin ointment. A sterile dressing was applied. The drapes were removed. The patient was subsequently returned to the supine position where they were extubated by the anesthesia team. He was then transported to the post anesthesia care unit in stable condition. All sponge instrument and needle counts were reportedly correct at the end of this case.

## 2019-11-29 NOTE — Anesthesia Preprocedure Evaluation (Addendum)
Anesthesia Evaluation  Patient identified by MRN, date of birth, ID band Patient awake    Reviewed: Allergy & Precautions, NPO status , Patient's Chart, lab work & pertinent test results  Airway Mallampati: IV  TM Distance: >3 FB Neck ROM: Full  Mouth opening: Limited Mouth Opening  Dental no notable dental hx. (+) Dental Advisory Given, Poor Dentition   Pulmonary former smoker,    Pulmonary exam normal breath sounds clear to auscultation       Cardiovascular hypertension, Pt. on medications Normal cardiovascular exam+ dysrhythmias Atrial Fibrillation  Rhythm:Regular Rate:Normal     Neuro/Psych TIAnegative psych ROS   GI/Hepatic Neg liver ROS, GERD  Controlled,  Endo/Other  Hypothyroidism   Renal/GU negative Renal ROS  negative genitourinary   Musculoskeletal  (+) Arthritis , Osteoarthritis,  Spondylosis lumbar spine    Abdominal Normal abdominal exam  (+)   Peds negative pediatric ROS (+)  Hematology negative hematology ROS (+) hct 45.3   Anesthesia Other Findings Very HOH  Reproductive/Obstetrics negative OB ROS                            Anesthesia Physical Anesthesia Plan  ASA: III  Anesthesia Plan: General   Post-op Pain Management:    Induction: Intravenous  PONV Risk Score and Plan: 2 and Ondansetron, Dexamethasone and Treatment may vary due to age or medical condition  Airway Management Planned: Oral ETT  Additional Equipment: None  Intra-op Plan:   Post-operative Plan: Extubation in OR  Informed Consent: I have reviewed the patients History and Physical, chart, labs and discussed the procedure including the risks, benefits and alternatives for the proposed anesthesia with the patient or authorized representative who has indicated his/her understanding and acceptance.     Dental advisory given  Plan Discussed with: CRNA  Anesthesia Plan Comments:         Anesthesia Quick Evaluation

## 2019-11-29 NOTE — Transfer of Care (Signed)
Immediate Anesthesia Transfer of Care Note  Patient: Alvin Chase  Procedure(s) Performed: POSTERIOR LUMBAR INTERBODY FUSION,INTERBODY PROSTHESIS,POSTERIOR INSTRUMENTATION LUMBAR FOUR- LUMBAR FIVE; LEFT LUMBAR TWO- LUMBAR THREE DISCECTOMY (N/A )  Patient Location: PACU  Anesthesia Type:General  Level of Consciousness: awake, alert  and oriented  Airway & Oxygen Therapy: Patient Spontanous Breathing and Patient connected to nasal cannula oxygen  Post-op Assessment: Report given to RN and Post -op Vital signs reviewed and stable  Post vital signs: Reviewed and stable  Last Vitals:  Vitals Value Taken Time  BP 140/101 11/29/19 1714  Temp    Pulse 108 11/29/19 1716  Resp 22 11/29/19 1716  SpO2 96 % 11/29/19 1716  Vitals shown include unvalidated device data.  Last Pain:  Vitals:   11/29/19 1129  TempSrc:   PainSc: 7       Patients Stated Pain Goal: 0 ([id number])  Complications: No apparent anesthesia complications

## 2019-11-29 NOTE — H&P (Signed)
Subjective: The patient is a 69 year old white male who has complained of chronic back and leg pain.  More recently has developed severe left leg pain.  He has failed medical management.  He was worked up with a lumbar MRI which demonstrated an L4-5 spinal listhesis with spinal stenosis and a left L2-3 herniated disc.  I discussed the various treatment options with him.  He has decided to proceed with surgery.  Past Medical History:  Diagnosis Date  . A-fib [facility])    " once   brought on by [place] Disease " per daughter  . Cancer ([place])    skin  . Constipation   . GERD (gastroesophageal reflux disease)   . HOH (hard of hearing)   . Hyperlipemia   . Hypertension   . Hypothyroidism   . Lyme disease   . Meningitis   . Spondylosis of lumbar spine   . Spondylosis of lumbar spine   . TIA (transient ischemic attack)     Past Surgical History:  Procedure Laterality Date  . COLONOSCOPY W/ BIOPSIES AND POLYPECTOMY    . JOINT REPLACEMENT     B/L knees    Allergies  Allergen Reactions  . Amoxicillin Rash  . Hydrocodone-Acetaminophen Rash  . Plaquenil  [Hydroxychloroquine Sulfate] Rash    Social History   Tobacco Use  . Smoking status: Former Smoker    Types: Cigarettes  . Smokeless tobacco: Never Used  . Tobacco comment: quit at least 35 years ago  Substance Use Topics  . Alcohol use: Yes    Comment: occasional    History reviewed. No pertinent family history. Prior to Admission medications   Medication Sig Start Date End Date Taking? Authorizing Provider  acetaminophen (TYLENOL) 500 MG tablet Take 1,000 mg by mouth every 6 (six) hours as needed for moderate pain.   Yes [provider]  gabapentin (NEURONTIN) 300 MG capsule Take 600 mg by mouth 3 (three) times daily.    Yes [provider]  levothyroxine (SYNTHROID, LEVOTHROID) 100 MCG tablet Take 100 mcg by mouth daily before breakfast.    Yes [provider]  OVER THE COUNTER MEDICATION Place 12 drops  under the tongue 3 (three) times daily as needed (pain). CBD Oil   Yes [provider]  oxyCODONE-acetaminophen (PERCOCET) 10-325 MG tablet Take 1 tablet by mouth every 4 (four) hours as needed for pain.   Yes [provider]  doxycycline (VIBRAMYCIN) 100 MG capsule Take 1 capsule (100 mg total) by mouth 2 (two) times daily. Patient not taking: Reported on 11/16/2019 08/16/18   Wurst, [place], PA-C  lisinopril (PRINIVIL,ZESTRIL) 5 MG tablet Take 5 mg by mouth daily as needed (high blood pressure).     [provider]  simvastatin (ZOCOR) 20 MG tablet Take 20 mg by mouth daily.     [provider]     Review of Systems  Positive ROS: As above  All other systems have been reviewed and were otherwise negative with the exception of those mentioned in the HPI and as above.  Objective: Vital signs in last 24 hours: Temp:  [98.6 F (37 C)] 98.6 F (37 C) (04/22 1109) Pulse Rate:  [82] 82 (04/22 1109) Resp:  [18] 18 (04/22 1109) BP: (178-192)/(82-85) 178/82 (04/22 1139) SpO2:  [100 %] 100 % (04/22 1109) Weight:  [79.4 kg] 79.4 kg (04/22 1109) Estimated body mass index is 26.61 kg/m as calculated from the following:   Height as of this encounter: 5\' 8"  (1.727 m).  Weight as of this encounter: 79.4 kg.   General Appearance: Alert Head: Normocephalic, without obvious abnormality, atraumatic Eyes: PERRL, conjunctiva/corneas clear, EOM's intact,    Ears: Normal  Throat: Normal  Neck: Supple, Back: unremarkable Lungs: Clear to auscultation bilaterally, respirations unlabored Heart: Regular rate and rhythm, no murmur, rub or gallop Abdomen: Soft, non-tender Extremities: Extremities normal, atraumatic, no cyanosis or edema Skin: unremarkable  NEUROLOGIC:   Mental status: alert and oriented,Motor Exam - grossly normal Sensory Exam - grossly normal Reflexes:  Coordination - grossly normal Gait - grossly normal Balance - grossly normal Cranial  Nerves: I: smell Not tested  II: visual acuity  OS: Normal  OD: Normal   II: visual fields Full to confrontation  II: pupils Equal, round, reactive to light  III,VII: ptosis None  III,IV,VI: extraocular muscles  Full ROM  V: mastication Normal  V: facial light touch sensation  Normal  V,VII: corneal reflex  Present  VII: facial muscle function - upper  Normal  VII: facial muscle function - lower Normal  VIII: hearing Not tested  IX: soft palate elevation  Normal  IX,X: gag reflex Present  XI: trapezius strength  5/5  XI: sternocleidomastoid strength 5/5  XI: neck flexion strength  5/5  XII: tongue strength  Normal    Data Review Lab Results  Component Value Date   WBC 5.6 11/26/2019   HGB 14.9 11/26/2019   HCT 45.3 11/26/2019   MCV 93.4 11/26/2019   PLT 246 11/26/2019   Lab Results  Component Value Date   NA 138 11/26/2019   K 4.2 11/26/2019   CL 104 11/26/2019   CO2 25 11/26/2019   BUN 19 11/26/2019   CREATININE 1.11 11/26/2019   GLUCOSE 106 (H) 11/26/2019   No results found for: INR, PROTIME  Assessment/Plan: L4-5 spondylolisthesis, spinal stenosis, L2-3 herniated disc, lumbago, lumbar radiculopathy, neurogenic claudication: I have discussed the situation with the patient.  I have reviewed his imaging studies with him and pointed out the abnormalities.  We have discussed the various treatment options including surgery.  I have described the surgical treatment option of an L4-5 decompression instrumentation fusion and an L2-3 discectomy.  I have shown him surgical models.  I have given him a surgical pamphlet.  We have discussed the risks, benefits, alternatives, expected postoperative course, and likelihood of achieving our goals with surgery.  I have answered all his questions.  He has decided to proceed with surgery.   Ophelia Charter 11/29/2019 12:52 PM

## 2019-11-29 NOTE — Progress Notes (Signed)
Pharmacy Antibiotic Note  TITAN DODA is a 69 y.o. male admitted on 11/29/2019 for lumbar surgery.  Pharmacy has been consulted for Vancomycin dosing for surgical prophylaxis.  He received vancomycin 1000mg  preop @11 :37 11/29/19 today.  S/p lumbar bilateral L4-5  laminotomy/foraminotomies/medial facetectomy /fusion, left L2-3 discectomy.  Drains: none Scr 1.11,  CrCl ~60 ml/min  Plan: Vancomycin 750 mg  X 1 @ 23:00 tonight Pharmacy signing off.   Height: 5\' 8"  (172.7 cm) Weight: 79.4 kg (175 lb) IBW/kg (Calculated) : 68.4  Temp (24hrs), Avg:97.8 F (36.6 C), Min:97.1 F (36.2 C), Max:98.6 F (37 C)  Recent Labs  Lab 11/26/19 1222  WBC 5.6  CREATININE 1.11    Estimated Creatinine Clearance: 59.9 mL/min (by C-G formula based on SCr of 1.11 mg/dL).    Allergies  Allergen Reactions  . Amoxicillin Rash  . Hydrocodone-Acetaminophen Rash  . Plaquenil  [Hydroxychloroquine Sulfate] Rash    Thank you for allowing pharmacy to be a part of this patient's care.  Nicole Cella, RPh Clinical Pharmacist Please check AMION for all [place] phone numbers After 10:00 PM, call [place] [phone] 11/29/2019 7:10 PM

## 2019-11-30 LAB — BASIC METABOLIC PANEL
Anion gap: 11 (ref 5–15)
BUN: 23 mg/dL (ref 8–23)
CO2: 21 mmol/L — ABNORMAL LOW (ref 22–32)
Calcium: 8.7 mg/dL — ABNORMAL LOW (ref 8.9–10.3)
Chloride: 105 mmol/L (ref 98–111)
Creatinine, Ser: 1.13 mg/dL (ref 0.61–1.24)
GFR calc Af Amer: >60 mL/min (ref >60)
GFR calc non Af Amer: >60 mL/min (ref >60)
Glucose, Bld: 133 mg/dL — ABNORMAL HIGH (ref 70–99)
Potassium: 4.4 mmol/L (ref 3.5–5.1)
Sodium: 137 mmol/L (ref 135–145)

## 2019-11-30 LAB — CBC
HCT: 38.7 % — ABNORMAL LOW (ref 39.0–52.0)
Hemoglobin: 13 g/dL (ref 13.0–17.0)
MCH: 30.3 pg (ref 26.0–34.0)
MCHC: 33.6 g/dL (ref 30.0–36.0)
MCV: 90.2 fL (ref 80.0–100.0)
Platelets: 236 10*3/uL (ref 150–400)
RBC: 4.29 MIL/uL (ref 4.22–5.81)
RDW: 14 % (ref 11.5–15.5)
WBC: 12.1 10*3/uL — ABNORMAL HIGH (ref 4.0–10.5)
nRBC: 0 % (ref 0.0–0.2)

## 2019-11-30 MED ORDER — DOCUSATE SODIUM 100 MG PO CAPS: 100.0000 mg | ORAL_CAPSULE | Freq: Two times a day (BID) | ORAL | 0 refills | Status: DC

## 2019-11-30 MED ORDER — ACETAMINOPHEN 650 MG RE SUPP: 650.0000 mg | RECTAL | Status: DC | PRN

## 2019-11-30 MED ORDER — OXYCODONE HCL 10 MG PO TABS: 10.0000 mg | ORAL_TABLET | ORAL | 0 refills | Status: DC | PRN

## 2019-11-30 MED ORDER — ACETAMINOPHEN 325 MG PO TABS: 650.0000 mg | ORAL_TABLET | ORAL | Status: DC | PRN

## 2019-11-30 MED ORDER — CYCLOBENZAPRINE HCL 10 MG PO TABS: 10.0000 mg | ORAL_TABLET | Freq: Three times a day (TID) | ORAL | 0 refills | Status: DC | PRN

## 2019-11-30 NOTE — Discharge Summary (Signed)
Physician Discharge Summary     Providing Compassionate, Quality Care - Together   Patient ID: Alvin Chase MRN: [id number] DOB/AGE: 71-19-1952 71 y.o.  Admit date: 11/29/2019 Discharge date: 11/30/2019  Admission Diagnoses: Spondylolisthesis of lumbar region  Discharge Diagnoses:  Active Problems:   Spondylolisthesis of lumbar region   Discharged Condition: good  Hospital Course: Patient was admitted to 3C08 following L4-5 transforaminal lumbar interbody fusion by Dr. Arnoldo Morale on 11/29/2019. His postoperative course was uncomplicated. He has worked with physical and occupational therapies. His pain is well-controlled on oral analgesics. He is ready for discharge home.  Consults: rehabilitation medicine  Significant Diagnostic Studies: radiology: DG Lumbar Spine 2-3 Views  Result Date: 11/29/2019 CLINICAL DATA:  Intraoperative evaluation for L4-L5 fusion EXAM: LUMBAR SPINE - 2-3 VIEW COMPARISON:  09/17/2019 FINDINGS: Two lateral films were obtained during the operative procedure. Image 1 obtained at 14:07 demonstrates surgical instrumentation with surgical probe posterior to the L3 vertebral body. Image 2 taken at 14:16 demonstrates surgical probe at L3 and L5, forceps posterior to the L4 spinous process. Continued grade 1 anterolisthesis of L4 relative to L5. Pronounced spondylosis at L3/L4. Diffuse facet hypertrophy. IMPRESSION: 1. Intraoperative exam is as above. Electronically Signed   By: Randa Ngo M.D.   On: 11/29/2019 19:45   DG Lumbar Spine 2-3 Views  Result Date: 11/29/2019 CLINICAL DATA:  L4-5 fusion EXAM: LUMBAR SPINE - 2-3 VIEW; DG C-ARM 1-60 MIN COMPARISON:  None. FINDINGS: Intraoperative spot images demonstrate posterior fusion at L4-5. No hardware complicating feature. Normal alignment. IMPRESSION: Posterior fusion at L4-5.  No complicating feature. Electronically Signed   By: Rolm Baptise M.D.   On: 11/29/2019 19:43   DG C-Arm 1-60 Min  Result Date:  11/29/2019 CLINICAL DATA:  L4-5 fusion EXAM: LUMBAR SPINE - 2-3 VIEW; DG C-ARM 1-60 MIN COMPARISON:  None. FINDINGS: Intraoperative spot images demonstrate posterior fusion at L4-5. No hardware complicating feature. Normal alignment. IMPRESSION: Posterior fusion at L4-5.  No complicating feature. Electronically Signed   By: Rolm Baptise M.D.   On: 11/29/2019 19:43     Treatments: surgery:  Bilateral L4-5 laminotomy/foraminotomies/medial facetectomy to decompress the bilateral L4 and L5 nerve roots(the work required to do this was in addition to the work required to do the posterior lumbar interbody fusion because of the patient's spinal stenosis, facet arthropathy. Etc. requiring a wide decompression of the nerve roots.);  L4-5 transforaminal lumbar interbody fusion with local morselized autograft bone and Zimmer DBM; insertion of interbody prosthesis at L4-5 (globus peek expandable interbody prosthesis); posterior nonsegmental instrumentation from L4 to L5 with globus titanium pedicle screws and rods; posterior lateral arthrodesis at L4-5 with local morselized autograft bone and Zimmer DBM; left L2-3 discectomy  Discharge Exam: Blood pressure 130/78, pulse 77, temperature 98.6 F (37 C), temperature source Oral, resp. rate 16, height 5\' 8"  (1.727 m), weight 79.4 kg, SpO2 98 %.   Alert and oriented x 4 PERRLA CN II-XII grossly intact MAE, Strength and sensation intact Incision is covered with Honeycomb dressing and Steri Strips; Dressing is clean, dry, and intact   Disposition: Discharge disposition: 01-Home or Self Care        Allergies as of 11/30/2019      Reactions   Amoxicillin Rash   Hydrocodone-acetaminophen Rash   Plaquenil  [hydroxychloroquine Sulfate] Rash      Medication List    STOP taking these medications   doxycycline 100 MG capsule Commonly known as: VIBRAMYCIN   oxyCODONE-acetaminophen 10-325 MG tablet Commonly known  as: PERCOCET     TAKE these medications    acetaminophen 500 MG tablet Commonly known as: TYLENOL Take 1,000 mg by mouth every 6 (six) hours as needed for moderate pain.   cyclobenzaprine 10 MG tablet Commonly known as: FLEXERIL Take 1 tablet (10 mg total) by mouth 3 (three) times daily as needed for muscle spasms.   docusate sodium 100 MG capsule Commonly known as: COLACE Take 1 capsule (100 mg total) by mouth 2 (two) times daily.   gabapentin 300 MG capsule Commonly known as: NEURONTIN Take 600 mg by mouth 3 (three) times daily.   levothyroxine 100 MCG tablet Commonly known as: SYNTHROID Take 100 mcg by mouth daily before breakfast.   lisinopril 5 MG tablet Commonly known as: ZESTRIL Take 5 mg by mouth daily as needed (high blood pressure).   OVER THE COUNTER MEDICATION Place 12 drops under the tongue 3 (three) times daily as needed (pain). CBD Oil   Oxycodone HCl 10 MG Tabs Take 1 tablet (10 mg total) by mouth every 4 (four) hours as needed for severe pain ((score 7 to 10)).   simvastatin 20 MG tablet Commonly known as: ZOCOR Take 20 mg by mouth daily.      Follow-up Information    Newman Pies, MD. Schedule an appointment as soon as possible for a visit in 2 week(s).   Specialty: Neurosurgery Contact information: [address] [place] [address] [place] [address] [phone]           Signed: Patricia Nettle 11/30/2019, 9:51 AM

## 2019-11-30 NOTE — Evaluation (Signed)
Occupational Therapy Evaluation Patient Details Name: Alvin Chase MRN: [id number] DOB: 1950-10-04 Today's Date: 11/30/2019    History of Present Illness Pt is a 69 y/o male s/p TLIF L4-L5. PMH including but not limited to a-fib, HTN and Lyme disease.    Clinical Impression   PTA, pt was living with his wife and was independent with BADLs but limited by back/leg pain. Pt currently performing ADLs and functional mobility at Supervision level. Provided pt with education and handout on back precautions, sleep positioning, bed mobility, grooming, brace management, UB ADLs, LB ADLs, care transfer, toileting, and tub transfer with use of 3N1 as needed. Recommend dc to home once medically stable per physician. All acute OT needs met and will sign off. Thank you.     Follow Up Recommendations  No OT follow up    Equipment Recommendations  3 in 1 bedside commode    Recommendations for Other Services PT consult     Precautions / Restrictions Precautions Precautions: Back Precaution Booklet Issued: Yes (comment) Precaution Comments: Providing handout and education on back precautions Required Braces or Orthoses: Spinal Brace Spinal Brace: Lumbar corset;Applied in sitting position Restrictions Weight Bearing Restrictions: No      Mobility Bed Mobility Overal bed mobility: Needs Assistance Bed Mobility: Rolling;Sidelying to Sit Rolling: Supervision Sidelying to sit: Supervision       General bed mobility comments: In recliner upon arrival  Transfers Overall transfer level: Needs assistance Equipment used: None Transfers: Sit to/from Stand Sit to Stand: Supervision         General transfer comment: no instability    Balance Overall balance assessment: No apparent balance deficits (not formally assessed)                                         ADL either performed or assessed with clinical judgement   ADL Overall ADL's : Needs assistance/impaired                                       General ADL Comments: Pt performing ADLs and functional mobility at Supervision level. Provided education and handout on back precautions, bed mobility, brace management, UB ADLs, LB ADLs, toileting, tub transfer, use of 3N1, sleep positioning, and grooming.     Vision Baseline Vision/History: Wears glasses Patient Visual Report: No change from baseline       Perception     Praxis      Pertinent Vitals/Pain Pain Assessment: Faces Faces Pain Scale: Hurts a little bit Pain Location: back Pain Descriptors / Indicators: Sore Pain Intervention(s): Monitored during session;Limited activity within patient's tolerance;Repositioned     Hand Dominance Right   Extremity/Trunk Assessment Upper Extremity Assessment Upper Extremity Assessment: Overall WFL for tasks assessed   Lower Extremity Assessment Lower Extremity Assessment: Defer to PT evaluation   Cervical / Trunk Assessment Cervical / Trunk Assessment: Other exceptions Cervical / Trunk Exceptions: s/p lumbar sx   Communication Communication Communication: HOH   Cognition Arousal/Alertness: Awake/alert Behavior During Therapy: WFL for tasks assessed/performed Overall Cognitive Status: Within Functional Limits for tasks assessed                                     General Comments  Wife present  throughout    Exercises     Shoulder Instructions      Home Living Family/patient expects to be discharged to:: Private residence Living Arrangements: Spouse/significant other Available Help at Discharge: Family;Available 24 hours/day Type of Home: House Home Access: Stairs to enter [employer] of Steps: 1 Entrance Stairs-Rails: None Home Layout: One level     Bathroom Shower/Tub: Tub/shower unit;Walk-in shower(walk-in very small)   Bathroom Toilet: Standard     Home Equipment: Walker - 2 wheels;Hand held shower head(use RW and very old)           Prior Functioning/Environment Level of Independence: Needs assistance  Gait / Transfers Assistance Needed: using w/c due to pain ADL's / Homemaking Assistance Needed: Doing BADLs with increased time. help in and out of tub from wife            OT Problem List: Decreased strength;Decreased range of motion;Decreased activity tolerance;Impaired balance (sitting and/or standing);Decreased knowledge of use of DME or AE;Decreased knowledge of precautions;Pain      OT Treatment/Interventions:      OT Goals(Current goals can be found in the care plan section) Acute Rehab OT Goals Patient Stated Goal: "home today" OT Goal Formulation: All assessment and education complete, DC therapy  OT Frequency:     Barriers to D/C:            Co-evaluation              AM-PAC OT "6 Clicks" Daily Activity     Outcome Measure Help from another person eating meals?: A Little Help from another person taking care of personal grooming?: A Little Help from another person toileting, which includes using toliet, bedpan, or urinal?: A Little Help from another person bathing (including washing, rinsing, drying)?: A Little Help from another person to put on and taking off regular upper body clothing?: A Little Help from another person to put on and taking off regular lower body clothing?: A Little 6 Click Score: 18   End of Session Equipment Utilized During Treatment: Back brace Nurse Communication: Mobility status  Activity Tolerance: Patient tolerated treatment well Patient left: in chair;with call bell/phone within reach  OT Visit Diagnosis: Unsteadiness on feet (R26.81);Other abnormalities of gait and mobility (R26.89);Muscle weakness (generalized) (M62.81);Pain Pain - part of body: (Back)                Time: 1140-1230 OT Time Calculation (min): 50 min Charges:  OT General Charges $OT Visit: 1 Visit OT Evaluation $OT Eval Low Complexity: 1 Low OT Treatments $Self Care/Home  Management : 23-37 mins  Rowyn Spilde MSOT, OTR/L Acute Rehab Pager: [phone] Office: [place] 11/30/2019, 12:56 PM

## 2019-11-30 NOTE — Evaluation (Signed)
Physical Therapy Evaluation Patient Details Name: Alvin Chase MRN: [id number] DOB: 10-20-1950 Today's Date: 11/30/2019   History of Present Illness  Pt is a 69 y/o male s/p TLIF L4-L5. PMH including but not limited to a-fib, HTN and Lyme disease.   Clinical Impression  Pt presented supine in bed with HOB elevated, awake and willing to participate in therapy session. Prior to admission, pt reported that he was independent with all functional mobility and ADLs. Pt lives with his wife in a single level home with one step to enter. At the time of evaluation, pt overall at a supervision level with all functional mobility including hallway ambulation without an AD. Pt participated in stair training this session without difficulties. PT provided pt education re: back precautions, car transfers and a generalized walking program for pt to initiate upon d/c home. No further acute PT needs identified at this time. PT signing off.     Follow Up Recommendations No PT follow up    Equipment Recommendations  None recommended by PT    Recommendations for Other Services       Precautions / Restrictions Precautions Precautions: Back Precaution Booklet Issued: Yes (comment) Precaution Comments: reviewed 3/3 back precautions with pt throughout Required Braces or Orthoses: Spinal Brace Spinal Brace: Lumbar corset;Applied in sitting position Restrictions Weight Bearing Restrictions: No      Mobility  Bed Mobility Overal bed mobility: Needs Assistance Bed Mobility: Rolling;Sidelying to Sit Rolling: Supervision Sidelying to sit: Supervision       General bed mobility comments: supervision for safety, use of bed rails  Transfers Overall transfer level: Needs assistance Equipment used: None Transfers: Sit to/from Stand Sit to Stand: Supervision         General transfer comment: no instability  Ambulation/Gait Ambulation/Gait assistance: Supervision Gait Distance (Feet): 500  Feet Assistive device: None Gait Pattern/deviations: Step-through pattern;Decreased stride length Gait velocity: decreased   General Gait Details: pt ambulated in hallway without use of an AD with no LOB or need for physical assistance  Stairs Stairs: Yes Stairs assistance: Supervision Stair Management: One rail Right Number of Stairs: 1 General stair comments: no instability noted  Wheelchair Mobility    Modified Rankin (Stroke Patients Only)       Balance Overall balance assessment: No apparent balance deficits (not formally assessed)                                           Pertinent Vitals/Pain Pain Assessment: Faces Faces Pain Scale: Hurts a little bit Pain Location: back Pain Descriptors / Indicators: Sore Pain Intervention(s): Monitored during session;Repositioned    Home Living Family/patient expects to be discharged to:: Private residence Living Arrangements: Spouse/significant other Available Help at Discharge: Family;Available 24 hours/day Type of Home: House Home Access: Stairs to enter Entrance Stairs-Rails: None Entrance Stairs-Number of Steps: 1 Home Layout: One level Home Equipment: Walker - 2 wheels;Hand held shower head(use RW and very old)      Prior Function Level of Independence: Needs assistance   Gait / Transfers Assistance Needed: using w/c due to pain  ADL's / Homemaking Assistance Needed: Doing BADLs with increased time. help in and out of tub from wife        Hand Dominance        Extremity/Trunk Assessment   Upper Extremity Assessment Upper Extremity Assessment: Defer to OT evaluation;Overall [facility] for tasks assessed  Lower Extremity Assessment Lower Extremity Assessment: Overall WFL for tasks assessed    Cervical / Trunk Assessment Cervical / Trunk Assessment: Other exceptions Cervical / Trunk Exceptions: s/p lumbar sx  Communication   Communication: HOH  Cognition Arousal/Alertness:  Awake/alert Behavior During Therapy: WFL for tasks assessed/performed Overall Cognitive Status: Within Functional Limits for tasks assessed                                        General Comments      Exercises     Assessment/Plan    PT Assessment Patent does not need any further PT services  PT Problem List         PT Treatment Interventions      PT Goals (Current goals can be found in the Care Plan section)  Acute Rehab PT Goals Patient Stated Goal: "home today" PT Goal Formulation: All assessment and education complete, DC therapy    Frequency     Barriers to discharge        Co-evaluation               AM-PAC PT "6 Clicks" Mobility  Outcome Measure Help needed turning from your back to your side while in a flat bed without using bedrails?: None Help needed moving from lying on your back to sitting on the side of a flat bed without using bedrails?: None Help needed moving to and from a bed to a chair (including a wheelchair)?: None Help needed standing up from a chair using your arms (e.g., wheelchair or bedside chair)?: None Help needed to walk in hospital room?: None Help needed climbing 3-5 steps with a railing? : None 6 Click Score: 24    End of Session Equipment Utilized During Treatment: Back brace Activity Tolerance: Patient tolerated treatment well Patient left: in chair;with call bell/phone within reach Nurse Communication: Mobility status PT Visit Diagnosis: Other abnormalities of gait and mobility (R26.89);Pain Pain - part of body: (back)    Time: [id number] PT Time Calculation (min) (ACUTE ONLY): 17 min   Charges:   PT Evaluation $PT Eval Low Complexity: 1 Low          Eduard Clos, PT, DPT  Acute Rehabilitation Services Pager [phone] Office [place] 11/30/2019, 9:51 AM

## 2019-11-30 NOTE — Plan of Care (Signed)
Patient alert and oriented, Alvin Chase's well, voiding adequate amount of urine, swallowing without difficulty, c/o moderate pain and meds given prior to discharged for ride and discomfort. Patient discharged home with family. Script and discharged instructions given to patient. Patient and family stated understanding of instructions given. Patient has an appointment with Dr. Arnoldo Morale.

## 2019-11-30 NOTE — Discharge Instructions (Signed)
Wound Care Keep incision covered and dry for three days. You may shower. Do not put any creams, lotions, or ointments on incision. Leave steri-strips on back.  They will fall off by themselves. Activity Walk each and every day, increasing distance each day. No lifting greater than 5 lbs.  Avoid excessive neck motion. No driving for 2 weeks; may ride as a passenger locally. If provided with back brace, wear when out of bed.  It is not necessary to wear brace in bed. Diet Resume your normal diet.  Return to Work Will be discussed at you follow up appointment. Call Your Doctor If Any of These Occur Redness, drainage, or swelling at the wound.  Temperature greater than 101 degrees. Severe pain not relieved by pain medication. Incision starts to come apart. Follow Up Appt Call today for appointment in 2-3 weeks [phone]) or for problems.  If you have any hardware placed in your spine, you will need an x-ray before your appointment.

## 2019-12-03 NOTE — Anesthesia Postprocedure Evaluation (Signed)
Anesthesia Post Note  Patient: Alvin Chase  Procedure(s) Performed: POSTERIOR LUMBAR INTERBODY FUSION,INTERBODY PROSTHESIS,POSTERIOR INSTRUMENTATION LUMBAR FOUR- LUMBAR FIVE; LEFT LUMBAR TWO- LUMBAR THREE DISCECTOMY (N/A )     Patient location during evaluation: PACU Anesthesia Type: General Level of consciousness: awake and alert Pain management: pain level controlled Vital Signs Assessment: post-procedure vital signs reviewed and stable Respiratory status: spontaneous breathing, nonlabored ventilation, respiratory function stable and patient connected to nasal cannula oxygen Cardiovascular status: blood pressure returned to baseline and stable Postop Assessment: no apparent nausea or vomiting Anesthetic complications: no    Last Vitals:  Vitals:   11/30/19 0327 11/30/19 0714  BP: (!) 115/96 130/78  Pulse: 80 77  Resp: 20 16  Temp: 36.9 C 37 C  SpO2: 97% 98%    Last Pain:  Vitals:   11/30/19 1035  TempSrc:   PainSc: 3                  Lorene Samaan L Wessley Emert

## 2019-12-05 MED FILL — Sodium Chloride IV Soln 0.9%: INTRAVENOUS | Qty: 1000 | Status: AC

## 2019-12-05 MED FILL — Heparin Sodium (Porcine) Inj 1000 Unit/ML: INTRAMUSCULAR | Qty: 30 | Status: AC

## 2020-10-27 ENCOUNTER — Other Ambulatory Visit: Payer: Self-pay | Admitting: Neurosurgery

## 2020-11-24 NOTE — Pre-Procedure Instructions (Signed)
Surgical Instructions:    Your procedure is scheduled on Thursday, 11/27/20 (10:32 AM- 12:28 PM).  Report to [facility] Main Entrance "A" at 08:30 A.M., then check in with the Admitting office.  Call this number if you have any questions prior to, or have any problems the morning of surgery:  [phone]    Remember:  Do not eat or drink after midnight the night before your surgery.     Take these medicines the morning of surgery with A SIP OF WATER: levothyroxine (SYNTHROID, LEVOTHROID)  IF NEEDED: acetaminophen (TYLENOL) gabapentin (NEURONTIN)    As of today, STOP taking any Aspirin (unless otherwise instructed by your surgeon) Aleve, Naproxen, Ibuprofen, Motrin, Advil, Goody's, BC's, all herbal medications, fish oil, and all vitamins.            Special instructions:   [place]- Preparing For Surgery  Before surgery, you can play an important role. Because skin is not sterile, your skin needs to be as free of germs as possible. You can reduce the number of germs on your skin by washing with CHG (chlorahexidine gluconate) Soap before surgery.  CHG is an antiseptic cleaner which kills germs and bonds with the skin to continue killing germs even after washing.    Oral Hygiene is also important to reduce your risk of infection.  Remember - BRUSH YOUR TEETH THE MORNING OF SURGERY WITH YOUR REGULAR TOOTHPASTE  Please do not use if you have an allergy to CHG or antibacterial soaps. If your skin becomes reddened/irritated stop using the CHG.  Do not shave (including legs and underarms) for at least 48 hours prior to first CHG shower. It is OK to shave your face.  Please follow these instructions carefully.   1. Shower the NIGHT BEFORE SURGERY and the MORNING OF SURGERY  2. If you chose to wash your hair, wash your hair first as usual with your normal shampoo.  3. After you shampoo, rinse your hair and body thoroughly to remove the shampoo.  4. Wash Face and genitals (private  parts) with your normal soap.   5. Use CHG Soap as you would any other liquid soap. You can apply CHG directly to the skin and wash gently with a scrungie or a clean washcloth.   6. Apply the CHG Soap to your body ONLY FROM THE NECK DOWN.  Do not use on open wounds or open sores. Avoid contact with your eyes, ears, mouth and genitals (private parts). Wash Face and genitals (private parts)  with your normal soap.   7. Wash thoroughly, paying special attention to the area where your surgery will be performed.  8. Thoroughly rinse your body with warm water from the neck down.  9. DO NOT shower/wash with your normal soap after using and rinsing off the CHG Soap.  10. Pat yourself dry with a CLEAN TOWEL.  11. Wear CLEAN PAJAMAS to bed the night before surgery.  12. Place CLEAN SHEETS on your bed the night before your surgery.  13. DO NOT SLEEP WITH PETS.   Day of Surgery: SHOWER with CHG soap. Brush your teeth WITH YOUR REGULAR TOOTHPASTE. Wear Clean/Comfortable clothing the morning of surgery. Do not apply any deodorants/lotions.   Do not wear jewelry. Do not shave 48 hours prior to surgery.  Men may shave face and neck.  Do NOT Smoke (Tobacco/Vaping) or drink Alcohol 24 hours prior to your procedure. Do not bring valuables to the hospital. [facility] is not responsible for any belongings  or valuables.  If you use a CPAP at night, you may bring all equipment for your overnight stay.   Contacts, glasses, or dentures may not be worn into surgery, please bring cases for these belongings.   For patients admitted to the hospital, discharge time will be determined by your treatment team.   Patients discharged the day of surgery will not be allowed to drive home, and someone needs to stay with them for 24 hours.    Please read over the following fact sheets that you were given.

## 2020-11-25 ENCOUNTER — Inpatient Hospital Stay (HOSPITAL_COMMUNITY)
Admission: RE | Admit: 2020-11-25 | Discharge: 2020-11-25 | Disposition: A | Discharge status: Home / Self Care | Payer: Medicare PPO | Source: Ambulatory Visit

## 2020-12-05 NOTE — Pre-Procedure Instructions (Signed)
Surgical Instructions    Your procedure is scheduled on Wednesday, May 4th.  Report to [facility] Main Entrance "A" at 10:45 A.M., then check in with the Admitting office.  Call this number if you have problems the morning of surgery:  [phone]   If you have any questions prior to your surgery date call [phone]: Open Monday-Friday 8am-4pm    Remember:  Do not eat or drink after midnight the night before your surgery    Take these medicines the morning of surgery with A SIP OF WATER  levothyroxine (SYNTHROID, LEVOTHROID)  omeprazole (PRILOSEC)-as needed acetaminophen (TYLENOL)-as needed gabapentin (NEURONTIN) -as needed  As of today, STOP taking any Aspirin (unless otherwise instructed by your surgeon) Aleve, Naproxen, Ibuprofen, Motrin, Advil, Goody's, BC's, all herbal medications, fish oil, and all vitamins.                     Do NOT Smoke (Tobacco/Vaping) or drink Alcohol 24 hours prior to your procedure.  If you use a CPAP at night, you may bring all equipment for your overnight stay.   Contacts, glasses, piercing's, hearing aid's, dentures or partials may not be worn into surgery, please bring cases for these belongings.    For patients admitted to the hospital, discharge time will be determined by your treatment team.   Patients discharged the day of surgery will not be allowed to drive home, and someone needs to stay with them for 24 hours.    Special instructions:   [place]- Preparing For Surgery  Before surgery, you can play an important role. Because skin is not sterile, your skin needs to be as free of germs as possible. You can reduce the number of germs on your skin by washing with CHG (chlorahexidine gluconate) Soap before surgery.  CHG is an antiseptic cleaner which kills germs and bonds with the skin to continue killing germs even after washing.    Oral Hygiene is also important to reduce your risk of infection.  Remember - BRUSH YOUR TEETH THE  MORNING OF SURGERY WITH YOUR REGULAR TOOTHPASTE  Please do not use if you have an allergy to CHG or antibacterial soaps. If your skin becomes reddened/irritated stop using the CHG.  Do not shave (including legs and underarms) for at least 48 hours prior to first CHG shower. It is OK to shave your face.  Please follow these instructions carefully.   1. Shower the NIGHT BEFORE SURGERY and the MORNING OF SURGERY  2. If you chose to wash your hair, wash your hair first as usual with your normal shampoo.  3. After you shampoo, rinse your hair and body thoroughly to remove the shampoo.  4. Use CHG Soap as you would any other liquid soap. You can apply CHG directly to the skin and wash gently with a scrungie or a clean washcloth.   5. Apply the CHG Soap to your body ONLY FROM THE NECK DOWN.  Do not use on open wounds or open sores. Avoid contact with your eyes, ears, mouth and genitals (private parts). Wash Face and genitals (private parts)  with your normal soap.   6. Wash thoroughly, paying special attention to the area where your surgery will be performed.  7. Thoroughly rinse your body with warm water from the neck down.  8. DO NOT shower/wash with your normal soap after using and rinsing off the CHG Soap.  9. Pat yourself dry with a CLEAN TOWEL.  10. Wear CLEAN PAJAMAS to  bed the night before surgery  11. Place CLEAN SHEETS on your bed the night before your surgery  12. DO NOT SLEEP WITH PETS.   Day of Surgery: Shower with CHG soap. Do not wear jewelry Do not wear lotions, powders, colognes, or deodorant. Men may shave face and neck. Do not bring valuables to the hospital. [facility] is not responsible for any belongings or valuables. Wear Clean/Comfortable clothing the morning of surgery Remember to brush your teeth WITH YOUR REGULAR TOOTHPASTE.   Please read over the following fact sheets that you were given.

## 2020-12-08 ENCOUNTER — Encounter (HOSPITAL_COMMUNITY): Payer: Self-pay

## 2020-12-08 ENCOUNTER — Other Ambulatory Visit: Payer: Self-pay

## 2020-12-08 ENCOUNTER — Encounter (HOSPITAL_COMMUNITY)
Admission: RE | Admit: 2020-12-08 | Discharge: 2020-12-08 | Disposition: A | Discharge status: Home / Self Care | Payer: Medicare PPO | Source: Ambulatory Visit | Attending: Neurosurgery | Admitting: Neurosurgery

## 2020-12-08 DIAGNOSIS — Z01818 Encounter for other preprocedural examination: Secondary | ICD-10-CM | POA: Insufficient documentation

## 2020-12-08 LAB — TYPE AND SCREEN
ABO/RH(D): A POS
Antibody Screen: NEGATIVE

## 2020-12-08 LAB — CBC
HCT: 44.9 % (ref 39.0–52.0)
Hemoglobin: 14.6 g/dL (ref 13.0–17.0)
MCH: 30.7 pg (ref 26.0–34.0)
MCHC: 32.5 g/dL (ref 30.0–36.0)
MCV: 94.3 fL (ref 80.0–100.0)
Platelets: 288 10*3/uL (ref 150–400)
RBC: 4.76 MIL/uL (ref 4.22–5.81)
RDW: 13.1 % (ref 11.5–15.5)
WBC: 7.1 10*3/uL (ref 4.0–10.5)
nRBC: 0 % (ref 0.0–0.2)

## 2020-12-08 LAB — BASIC METABOLIC PANEL
Anion gap: 6 (ref 5–15)
BUN: 17 mg/dL (ref 8–23)
CO2: 26 mmol/L (ref 22–32)
Calcium: 8.7 mg/dL — ABNORMAL LOW (ref 8.9–10.3)
Chloride: 104 mmol/L (ref 98–111)
Creatinine, Ser: 1.35 mg/dL — ABNORMAL HIGH (ref 0.61–1.24)
GFR, Estimated: 56 mL/min — ABNORMAL LOW (ref >60)
Glucose, Bld: 97 mg/dL (ref 70–99)
Potassium: 4.1 mmol/L (ref 3.5–5.1)
Sodium: 136 mmol/L (ref 135–145)

## 2020-12-08 LAB — SURGICAL PCR SCREEN
MRSA, PCR: NEGATIVE
Staphylococcus aureus: NEGATIVE

## 2020-12-08 NOTE — Progress Notes (Signed)
PCP - Jacqualine Code, DO Cardiologist - denies  Chest x-ray - n/a EKG - 12/08/20 Stress Test - denies ECHO - 2021? Records requested from [facility] Cardiac Cath - denies  Sleep Study - denies CPAP - denies  Blood Thinner Instructions: n/a Aspirin Instructions: n/a  COVID TEST- Pt not tested for covid today due to pt testing + for covid on 10/08/20. Based on the guidelines the pt is in the 90 day window to not retest. The pt is still expected to quarantine until their procedure. Therefore, the pt can still have the scheduled procedure. These are the guidelines as follows:  Guidance: Patient previously tested + COVID; now past 90 day window seeking elective surgery (asymptomatic)  Retest patient If negative, proceed with surgery If positive, postpone surgery for 10 days from positive test Patient to quarantine for the (10 days) Do not retest again prior to surgery (even if scheduled a couple of weeks out) Use standard precautions for surgery         Date Name Specimen Result Interpretation Description Value Range Status Address   10/08/2020 Rapid SARS CoV 2 Ag, QL IA, Respiratory Specimen    Rapid SARS CoV 2 Ag, QL IA, Respiratory Specimen positive   In-Office Order: Internal Use Only DO Not Attach Compendium DO Not Attach Compendium, Do Not Delete/merge        Anesthesia review: Yes, pending possible Echo report from PCP.  Patient denies shortness of breath, fever, cough and chest pain at PAT appointment   All instructions explained to the patient, with a verbal understanding of the material. Patient agrees to go over the instructions while at home for a better understanding. Patient also instructed to self quarantine after being tested for COVID-19. The opportunity to ask questions was provided.

## 2020-12-09 NOTE — Anesthesia Preprocedure Evaluation (Addendum)
Anesthesia Evaluation  Patient identified by MRN, date of birth, ID band Patient awake    Reviewed: Allergy & Precautions, NPO status , Patient's Chart, lab work & pertinent test results  Airway Mallampati: II  TM Distance: >3 FB Neck ROM: Full    Dental  (+) Teeth Intact   Pulmonary neg pulmonary ROS, former smoker,    Pulmonary exam normal        Cardiovascular hypertension,  Rhythm:Regular Rate:Normal     Neuro/Psych TIAnegative psych ROS   GI/Hepatic Neg liver ROS, GERD  Medicated,  Endo/Other  Hypothyroidism   Renal/GU negative Renal ROS  negative genitourinary   Musculoskeletal  (+) Arthritis , Osteoarthritis,  Lumbar spondylolisthesis   Abdominal (+)  Abdomen: soft.    Peds  Hematology negative hematology ROS (+)   Anesthesia Other Findings   Reproductive/Obstetrics                           Anesthesia Physical Anesthesia Plan  ASA: II  Anesthesia Plan: General   Post-op Pain Management:    Induction: Intravenous  PONV Risk Score and Plan: 2 and Ondansetron, Dexamethasone, Midazolam and Treatment may vary due to age or medical condition  Airway Management Planned: Mask and Oral ETT  Additional Equipment: None  Intra-op Plan:   Post-operative Plan: Extubation in OR  Informed Consent: I have reviewed the patients History and Physical, chart, labs and discussed the procedure including the risks, benefits and alternatives for the proposed anesthesia with the patient or authorized representative who has indicated his/her understanding and acceptance.     Dental advisory given  Plan Discussed with: CRNA  Anesthesia Plan Comments: (Lab Results      Component                Value               Date                      WBC                      7.1                 12/08/2020                HGB                      14.6                12/08/2020                HCT                       44.9                12/08/2020                MCV                      94.3                12/08/2020                PLT                      288  12/08/2020           Lab Results      Component                Value               Date                      NA                       136                 12/08/2020                K                        4.1                 12/08/2020                CO2                      26                  12/08/2020                GLUCOSE                  97                  12/08/2020                BUN                      17                  12/08/2020                CREATININE               1.35 (H)            12/08/2020                CALCIUM                  8.7 (L)             12/08/2020                GFRNONAA                 56 (L)              12/08/2020                GFRAA                    >60                 11/30/2019           TTE 03/04/2020 (copy on chart): Conclusions: Normal left ventricular size and ejection fraction.  EF 60%.  Trace aortic regurgitation.  Trace mitral regurgitation.  Trace tricuspid regurgitation.  Mild tricuspid regurgitation.  Aortic root is normal in size.  No evidence of pulmonary hypertension.  No evidence of pericardial effusion.  Carotid Doppler 03/04/2020 (copy on chart): Impression: No significant plaque or hemodynamically  significant stenoses noted.)      Anesthesia Quick Evaluation

## 2020-12-10 ENCOUNTER — Ambulatory Visit (HOSPITAL_COMMUNITY): Payer: Medicare PPO | Admitting: Physician Assistant

## 2020-12-10 ENCOUNTER — Encounter (HOSPITAL_COMMUNITY): Payer: Self-pay | Admitting: Neurosurgery

## 2020-12-10 ENCOUNTER — Ambulatory Visit (HOSPITAL_COMMUNITY): Payer: Medicare PPO | Admitting: Anesthesiology

## 2020-12-10 ENCOUNTER — Other Ambulatory Visit: Payer: Self-pay

## 2020-12-10 ENCOUNTER — Ambulatory Visit (HOSPITAL_COMMUNITY)
Admission: RE | Admit: 2020-12-10 | Discharge: 2020-12-11 | Disposition: A | Discharge status: Home / Self Care | Payer: Medicare PPO | Attending: Neurosurgery | Admitting: Neurosurgery

## 2020-12-10 ENCOUNTER — Encounter (HOSPITAL_COMMUNITY)
Admission: RE | Disposition: A | Discharge status: Home / Self Care | Payer: Self-pay | Source: Home / Self Care | Attending: Neurosurgery

## 2020-12-10 ENCOUNTER — Ambulatory Visit (HOSPITAL_COMMUNITY): Payer: Medicare PPO

## 2020-12-10 DIAGNOSIS — Z87891 Personal history of nicotine dependence: Secondary | ICD-10-CM | POA: Diagnosis not present

## 2020-12-10 DIAGNOSIS — M5126 Other intervertebral disc displacement, lumbar region: Secondary | ICD-10-CM | POA: Diagnosis present

## 2020-12-10 DIAGNOSIS — M5116 Intervertebral disc disorders with radiculopathy, lumbar region: Secondary | ICD-10-CM | POA: Insufficient documentation

## 2020-12-10 DIAGNOSIS — Z85828 Personal history of other malignant neoplasm of skin: Secondary | ICD-10-CM | POA: Diagnosis not present

## 2020-12-10 DIAGNOSIS — Z885 Allergy status to narcotic agent status: Secondary | ICD-10-CM | POA: Insufficient documentation

## 2020-12-10 DIAGNOSIS — Z96653 Presence of artificial knee joint, bilateral: Secondary | ICD-10-CM | POA: Diagnosis not present

## 2020-12-10 DIAGNOSIS — Z888 Allergy status to other drugs, medicaments and biological substances status: Secondary | ICD-10-CM | POA: Diagnosis not present

## 2020-12-10 DIAGNOSIS — Z419 Encounter for procedure for purposes other than remedying health state, unspecified: Secondary | ICD-10-CM

## 2020-12-10 DIAGNOSIS — Z880 Allergy status to penicillin: Secondary | ICD-10-CM | POA: Insufficient documentation

## 2020-12-10 DIAGNOSIS — Z79899 Other long term (current) drug therapy: Secondary | ICD-10-CM | POA: Diagnosis not present

## 2020-12-10 DIAGNOSIS — Z8673 Personal history of transient ischemic attack (TIA), and cerebral infarction without residual deficits: Secondary | ICD-10-CM | POA: Insufficient documentation

## 2020-12-10 DIAGNOSIS — Z79890 Hormone replacement therapy (postmenopausal): Secondary | ICD-10-CM | POA: Diagnosis not present

## 2020-12-10 HISTORY — PX: LUMBAR LAMINECTOMY/DECOMPRESSION MICRODISCECTOMY: SHX5026

## 2020-12-10 SURGERY — LUMBAR LAMINECTOMY/DECOMPRESSION MICRODISCECTOMY 1 LEVEL
Anesthesia: General | Laterality: Left

## 2020-12-10 MED ORDER — LIDOCAINE 2% (20 MG/ML) 5 ML SYRINGE
INTRAMUSCULAR | Status: DC | PRN
  Administered 2020-12-10: 80 mg via INTRAVENOUS

## 2020-12-10 MED ORDER — PROPOFOL 10 MG/ML IV BOLUS
INTRAVENOUS | Status: DC | PRN
  Administered 2020-12-10: 170 mg via INTRAVENOUS

## 2020-12-10 MED ORDER — DEXAMETHASONE SODIUM PHOSPHATE 10 MG/ML IJ SOLN
INTRAMUSCULAR | Status: DC | PRN
  Administered 2020-12-10: 10 mg via INTRAVENOUS

## 2020-12-10 MED ORDER — ACETAMINOPHEN 650 MG RE SUPP: 650.0000 mg | RECTAL | Status: DC | PRN

## 2020-12-10 MED ORDER — CHLORHEXIDINE GLUCONATE CLOTH 2 % EX PADS: 6.0000 | MEDICATED_PAD | Freq: Once | CUTANEOUS | Status: DC

## 2020-12-10 MED ORDER — LEVOTHYROXINE SODIUM 100 MCG PO TABS: 100.0000 ug | ORAL_TABLET | Freq: Every day | ORAL | Status: DC

## 2020-12-10 MED ORDER — LACTATED RINGERS IV SOLN: INTRAVENOUS | Status: DC | PRN

## 2020-12-10 MED ORDER — VANCOMYCIN HCL IN DEXTROSE 1-5 GM/200ML-% IV SOLN: 1000.0000 mg | INTRAVENOUS | Status: AC

## 2020-12-10 MED ORDER — FENTANYL CITRATE (PF) 100 MCG/2ML IJ SOLN
25.0000 ug | INTRAMUSCULAR | Status: DC | PRN
  Administered 2020-12-10: 25 ug via INTRAVENOUS

## 2020-12-10 MED ORDER — CHLORHEXIDINE GLUCONATE 0.12 % MT SOLN
OROMUCOSAL | Status: AC
  Administered 2020-12-10: 15 mL via OROMUCOSAL
  Filled 2020-12-10: qty 15

## 2020-12-10 MED ORDER — ORAL CARE MOUTH RINSE: 15.0000 mL | Freq: Once | OROMUCOSAL | Status: AC

## 2020-12-10 MED ORDER — THROMBIN 5000 UNITS EX SOLR
OROMUCOSAL | Status: DC | PRN
  Administered 2020-12-10: 5 mL via TOPICAL

## 2020-12-10 MED ORDER — CHLORHEXIDINE GLUCONATE 0.12 % MT SOLN: 15.0000 mL | Freq: Once | OROMUCOSAL | Status: AC

## 2020-12-10 MED ORDER — BACITRACIN ZINC 500 UNIT/GM EX OINT
TOPICAL_OINTMENT | CUTANEOUS | Status: AC
  Filled 2020-12-10: qty 28.35

## 2020-12-10 MED ORDER — LACTATED RINGERS IV SOLN: INTRAVENOUS | Status: DC

## 2020-12-10 MED ORDER — FENTANYL CITRATE (PF) 250 MCG/5ML IJ SOLN
INTRAMUSCULAR | Status: AC
  Filled 2020-12-10: qty 5

## 2020-12-10 MED ORDER — FENTANYL CITRATE (PF) 250 MCG/5ML IJ SOLN
INTRAMUSCULAR | Status: DC | PRN
  Administered 2020-12-10: 100 ug via INTRAVENOUS
  Administered 2020-12-10: 50 ug via INTRAVENOUS

## 2020-12-10 MED ORDER — BISACODYL 10 MG RE SUPP: 10.0000 mg | Freq: Every day | RECTAL | Status: DC | PRN

## 2020-12-10 MED ORDER — CYCLOBENZAPRINE HCL 10 MG PO TABS
10.0000 mg | ORAL_TABLET | Freq: Three times a day (TID) | ORAL | Status: DC | PRN
Start: 2020-12-10 — End: 2020-12-11
  Administered 2020-12-10: 10 mg via ORAL
  Filled 2020-12-10: qty 1

## 2020-12-10 MED ORDER — THROMBIN 5000 UNITS EX SOLR
CUTANEOUS | Status: AC
  Filled 2020-12-10: qty 5000

## 2020-12-10 MED ORDER — ACETAMINOPHEN 325 MG PO TABS: 650.0000 mg | ORAL_TABLET | ORAL | Status: DC | PRN

## 2020-12-10 MED ORDER — PROPOFOL 10 MG/ML IV BOLUS
INTRAVENOUS | Status: AC
  Filled 2020-12-10: qty 20

## 2020-12-10 MED ORDER — MENTHOL 3 MG MT LOZG: 1.0000 | LOZENGE | OROMUCOSAL | Status: DC | PRN

## 2020-12-10 MED ORDER — HEMOSTATIC AGENTS (NO CHARGE) OPTIME
TOPICAL | Status: DC | PRN
  Administered 2020-12-10: 1 via TOPICAL

## 2020-12-10 MED ORDER — ONDANSETRON HCL 4 MG/2ML IJ SOLN
INTRAMUSCULAR | Status: DC | PRN
  Administered 2020-12-10: 4 mg via INTRAVENOUS

## 2020-12-10 MED ORDER — PROMETHAZINE HCL 25 MG/ML IJ SOLN: 6.2500 mg | INTRAMUSCULAR | Status: DC | PRN

## 2020-12-10 MED ORDER — VANCOMYCIN HCL 1000 MG/200ML IV SOLN
1000.0000 mg | Freq: Once | INTRAVENOUS | Status: AC
  Administered 2020-12-10: 1000 mg via INTRAVENOUS
  Filled 2020-12-10: qty 200

## 2020-12-10 MED ORDER — FENTANYL CITRATE (PF) 100 MCG/2ML IJ SOLN
INTRAMUSCULAR | Status: AC
  Filled 2020-12-10: qty 2

## 2020-12-10 MED ORDER — SODIUM CHLORIDE 0.9 % IV SOLN: 250.0000 mL | INTRAVENOUS | Status: DC

## 2020-12-10 MED ORDER — 0.9 % SODIUM CHLORIDE (POUR BTL) OPTIME
TOPICAL | Status: DC | PRN
  Administered 2020-12-10: 1000 mL

## 2020-12-10 MED ORDER — HYDROMORPHONE HCL 2 MG PO TABS
2.0000 mg | ORAL_TABLET | ORAL | Status: DC | PRN
  Administered 2020-12-10 – 2020-12-11 (×2): 2 mg via ORAL
  Filled 2020-12-10 (×2): qty 1

## 2020-12-10 MED ORDER — MORPHINE SULFATE (PF) 4 MG/ML IV SOLN: 4.0000 mg | INTRAVENOUS | Status: DC | PRN

## 2020-12-10 MED ORDER — SODIUM CHLORIDE 0.9% FLUSH: 3.0000 mL | Freq: Two times a day (BID) | INTRAVENOUS | Status: DC

## 2020-12-10 MED ORDER — PHENOL 1.4 % MT LIQD: 1.0000 | OROMUCOSAL | Status: DC | PRN

## 2020-12-10 MED ORDER — BUPIVACAINE-EPINEPHRINE (PF) 0.5% -1:200000 IJ SOLN
INTRAMUSCULAR | Status: DC | PRN
  Administered 2020-12-10: 10 mL

## 2020-12-10 MED ORDER — ONDANSETRON HCL 4 MG/2ML IJ SOLN: 4.0000 mg | Freq: Four times a day (QID) | INTRAMUSCULAR | Status: DC | PRN

## 2020-12-10 MED ORDER — IBUPROFEN 200 MG PO TABS: 200.0000 mg | ORAL_TABLET | Freq: Four times a day (QID) | ORAL | Status: DC | PRN

## 2020-12-10 MED ORDER — PHENYLEPHRINE 40 MCG/ML (10ML) SYRINGE FOR IV PUSH (FOR BLOOD PRESSURE SUPPORT)
PREFILLED_SYRINGE | INTRAVENOUS | Status: DC | PRN
  Administered 2020-12-10: 80 ug via INTRAVENOUS
  Administered 2020-12-10: 40 ug via INTRAVENOUS

## 2020-12-10 MED ORDER — SODIUM CHLORIDE 0.9% FLUSH: 3.0000 mL | INTRAVENOUS | Status: DC | PRN

## 2020-12-10 MED ORDER — SUGAMMADEX SODIUM 200 MG/2ML IV SOLN
INTRAVENOUS | Status: DC | PRN
  Administered 2020-12-10: 200 mg via INTRAVENOUS

## 2020-12-10 MED ORDER — ONDANSETRON HCL 4 MG PO TABS: 4.0000 mg | ORAL_TABLET | Freq: Four times a day (QID) | ORAL | Status: DC | PRN

## 2020-12-10 MED ORDER — PRAVASTATIN SODIUM 10 MG PO TABS
20.0000 mg | ORAL_TABLET | Freq: Every day | ORAL | Status: DC
  Administered 2020-12-10: 20 mg via ORAL
  Filled 2020-12-10: qty 2

## 2020-12-10 MED ORDER — DOCUSATE SODIUM 100 MG PO CAPS
100.0000 mg | ORAL_CAPSULE | Freq: Two times a day (BID) | ORAL | Status: DC
  Administered 2020-12-10: 100 mg via ORAL
  Filled 2020-12-10 (×2): qty 1

## 2020-12-10 MED ORDER — GABAPENTIN 300 MG PO CAPS
600.0000 mg | ORAL_CAPSULE | Freq: Every day | ORAL | Status: DC
  Administered 2020-12-10: 600 mg via ORAL
  Filled 2020-12-10: qty 2

## 2020-12-10 MED ORDER — PANTOPRAZOLE SODIUM 40 MG PO TBEC
40.0000 mg | DELAYED_RELEASE_TABLET | Freq: Every day | ORAL | Status: DC
  Administered 2020-12-11: 40 mg via ORAL
  Filled 2020-12-10: qty 1

## 2020-12-10 MED ORDER — VANCOMYCIN HCL IN DEXTROSE 1-5 GM/200ML-% IV SOLN
INTRAVENOUS | Status: AC
  Administered 2020-12-10: 1000 mg via INTRAVENOUS
  Filled 2020-12-10: qty 200

## 2020-12-10 MED ORDER — BACITRACIN ZINC 500 UNIT/GM EX OINT
TOPICAL_OINTMENT | CUTANEOUS | Status: DC | PRN
  Administered 2020-12-10: 1 via TOPICAL

## 2020-12-10 MED ORDER — ROCURONIUM BROMIDE 10 MG/ML (PF) SYRINGE
PREFILLED_SYRINGE | INTRAVENOUS | Status: DC | PRN
  Administered 2020-12-10: 20 mg via INTRAVENOUS
  Administered 2020-12-10: 60 mg via INTRAVENOUS

## 2020-12-10 MED ORDER — BUPIVACAINE-EPINEPHRINE 0.5% -1:200000 IJ SOLN
INTRAMUSCULAR | Status: AC
  Filled 2020-12-10: qty 1

## 2020-12-10 MED ORDER — GABAPENTIN 300 MG PO CAPS: 300.0000 mg | ORAL_CAPSULE | Freq: Every day | ORAL | Status: DC | PRN

## 2020-12-10 MED ORDER — ACETAMINOPHEN 500 MG PO TABS
1000.0000 mg | ORAL_TABLET | Freq: Four times a day (QID) | ORAL | Status: DC
  Administered 2020-12-10 – 2020-12-11 (×3): 1000 mg via ORAL
  Filled 2020-12-10 (×3): qty 2

## 2020-12-10 SURGICAL SUPPLY — 47 items
BAND RUBBER #18 3X1/16 STRL (MISCELLANEOUS) ×4 IMPLANT
BENZOIN TINCTURE PRP APPL 2/3 (GAUZE/BANDAGES/DRESSINGS) ×2 IMPLANT
BLADE CLIPPER SURG (BLADE) IMPLANT
BUR MATCHSTICK NEURO 3.0 LAGG (BURR) ×2 IMPLANT
BUR PRECISION FLUTE 6.0 (BURR) ×2 IMPLANT
CANISTER SUCT 3000ML PPV (MISCELLANEOUS) ×2 IMPLANT
CARTRIDGE OIL MAESTRO DRILL (MISCELLANEOUS) ×1 IMPLANT
COVER WAND RF STERILE (DRAPES) ×2 IMPLANT
DIFFUSER DRILL AIR PNEUMATIC (MISCELLANEOUS) ×2 IMPLANT
DRAPE LAPAROTOMY 100X72X124 (DRAPES) ×2 IMPLANT
DRAPE MICROSCOPE LEICA (MISCELLANEOUS) ×2 IMPLANT
DRAPE SURG 17X23 STRL (DRAPES) ×8 IMPLANT
DRSG OPSITE POSTOP 4X6 (GAUZE/BANDAGES/DRESSINGS) ×2 IMPLANT
ELECT BLADE 4.0 EZ CLEAN MEGAD (MISCELLANEOUS) ×2
ELECT REM PT RETURN 9FT ADLT (ELECTROSURGICAL) ×2
ELECTRODE BLDE 4.0 EZ CLN MEGD (MISCELLANEOUS) ×1 IMPLANT
ELECTRODE REM PT RTRN 9FT ADLT (ELECTROSURGICAL) ×1 IMPLANT
GAUZE 4X4 16PLY RFD (DISPOSABLE) IMPLANT
GAUZE SPONGE 4X4 12PLY STRL (GAUZE/BANDAGES/DRESSINGS) ×2 IMPLANT
GLOVE BIO SURGEON STRL SZ8 (GLOVE) ×2 IMPLANT
GLOVE BIO SURGEON STRL SZ8.5 (GLOVE) ×2 IMPLANT
GLOVE EXAM NITRILE XL STR (GLOVE) IMPLANT
GOWN STRL REUS W/ TWL LRG LVL3 (GOWN DISPOSABLE) IMPLANT
GOWN STRL REUS W/ TWL XL LVL3 (GOWN DISPOSABLE) ×1 IMPLANT
GOWN STRL REUS W/TWL 2XL LVL3 (GOWN DISPOSABLE) IMPLANT
GOWN STRL REUS W/TWL LRG LVL3 (GOWN DISPOSABLE)
GOWN STRL REUS W/TWL XL LVL3 (GOWN DISPOSABLE) ×1
HEMOSTAT POWDER KIT SURGIFOAM (HEMOSTASIS) ×2 IMPLANT
KIT BASIN OR (CUSTOM PROCEDURE TRAY) ×2 IMPLANT
KIT TURNOVER KIT B (KITS) ×2 IMPLANT
NEEDLE HYPO 21X1.5 SAFETY (NEEDLE) IMPLANT
NEEDLE HYPO 22GX1.5 SAFETY (NEEDLE) ×2 IMPLANT
NS IRRIG 1000ML POUR BTL (IV SOLUTION) ×2 IMPLANT
OIL CARTRIDGE MAESTRO DRILL (MISCELLANEOUS) ×2
PACK LAMINECTOMY NEURO (CUSTOM PROCEDURE TRAY) ×2 IMPLANT
PAD ARMBOARD 7.5X6 YLW CONV (MISCELLANEOUS) ×6 IMPLANT
PATTIES SURGICAL .5 X1 (DISPOSABLE) IMPLANT
SEALANT ADHERUS EXTEND TIP (MISCELLANEOUS) ×2 IMPLANT
SPONGE SURGIFOAM ABS GEL SZ50 (HEMOSTASIS) ×4 IMPLANT
STRIP CLOSURE SKIN 1/2X4 (GAUZE/BANDAGES/DRESSINGS) ×2 IMPLANT
SUT PROLENE 6 0 BV (SUTURE) ×4 IMPLANT
SUT VIC AB 1 CT1 18XBRD ANBCTR (SUTURE) ×1 IMPLANT
SUT VIC AB 1 CT1 8-18 (SUTURE) ×1
SUT VIC AB 2-0 CP2 18 (SUTURE) ×2 IMPLANT
TOWEL GREEN STERILE (TOWEL DISPOSABLE) ×2 IMPLANT
TOWEL GREEN STERILE FF (TOWEL DISPOSABLE) ×2 IMPLANT
WATER STERILE IRR 1000ML POUR (IV SOLUTION) ×2 IMPLANT

## 2020-12-10 NOTE — Transfer of Care (Signed)
Immediate Anesthesia Transfer of Care Note  Patient: Alvin Chase  Procedure(s) Performed: MICRODISCECTOMY LEFT LUMBAR TWO- LUMBAR THREE (Left )  Patient Location: PACU  Anesthesia Type:General  Level of Consciousness: drowsy and patient cooperative  Airway & Oxygen Therapy: Patient Spontanous Breathing and Patient connected to face mask oxygen  Post-op Assessment: Report given to RN and Post -op Vital signs reviewed and stable  Post vital signs: Reviewed and stable  Last Vitals:  Vitals Value Taken Time  BP 131/78 12/10/20 1641  Temp    Pulse 70 12/10/20 1643  Resp 16 12/10/20 1643  SpO2 100 % 12/10/20 1643  Vitals shown include unvalidated device data.  Last Pain:  Vitals:   12/10/20 1109  TempSrc:   PainSc: 0-No pain      Patients Stated Pain Goal: 3 (53/66/44 0347)  Complications: No complications documented.

## 2020-12-10 NOTE — Op Note (Signed)
Brief history: The patient is a 70 year old white male on whom I previously form of a left L2-3 discectomy.  He is developed recurrent back and left leg pain consistent with a lumbar radiculopathy.  He has failed medical management.  He was worked up with a lumbar MRI which demonstrated a recurrent herniated disc at L2-3 on the left.  I discussed the various treatment options with him.  He has decided proceed with surgery after weighing the risk benefits and alternatives.  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 Earle Gell  Asst.: Arnetha Massy, NP  Anesthesia: Gen. endotracheal  Estimated blood loss: 75 cc  Drains: None  Complications: None  Description of procedure: The patient was brought to the operating room by the anesthesia team. General endotracheal anesthesia was induced. The patient was turned to the prone position on the Wilson frame. The patient's lumbosacral region was then prepared with Betadine scrub and Betadine solution. Sterile drapes were applied.  I then injected the area to be incised with Marcaine with epinephrine solution. I then used a scalpel to make a linear midline incision over the L2-3 intervertebral disc space, incising through the old surgical scar. I then used electrocautery to perform a left sided subperiosteal dissection exposing the spinous process and lamina of L2 and L3. We obtained intraoperative radiograph to confirm our location. I then inserted the [facility] retractor for exposure.  We then brought the operative microscope into the field. Under its magnification and illumination we completed the microdissection. I used a high-speed drill to perform a redo laminotomy at L2-3 on the left. I then used a Kerrison punches to widen the laminotomy and removed the epidural scar tissue  and L2 through the left.  We also used a Kerrison punch to remove the cephalad aspect of the left L3 lamina.. We then used microdissection to free up the thecal sac and the left L3 nerve root from the epidural tissue. I then used a Kerrison punch to perform a foraminotomy at about the left L3 nerve root. We then using the nerve root retractor to gently retract the thecal sac and the L3 nerve root medially. This exposed the intervertebral disc. We identified the ruptured disc and remove it with the pituitary forceps.  We did create a small durotomy which we repaired with interrupted 6-0 Prolene sutures.  We inspected the intervertebral disc at L2-3 and left.  There was a hole in the annulus but no impending herniation.  We did not perform an intervertebral discectomy.  I then palpated along the ventral surface of the thecal sac and along exit route of the left L3 nerve root and noted that the neural structures were well decompressed. This completed the decompression.  We covered the exposed dura with adherance.  We then obtained hemostasis using bipolar electrocautery. We irrigated the wound out with bacitracin solution. We then removed the retractor. We then reapproximated the patient's thoracolumbar fascia with interrupted #1 Vicryl suture. We then reapproximated the patient's subcutaneous tissue with interrupted 2-0 Vicryl suture. We then reapproximated patient's skin with Steri-Strips and benzoin. The was then coated with bacitracin ointment. The drapes were removed. The patient was subsequently returned to the supine position where they were extubated by the anesthesia team. The patient was then transported to the postanesthesia care unit in stable condition. All sponge instrument and needle counts were reportedly correct at the end of  this case.

## 2020-12-10 NOTE — Progress Notes (Signed)
Pharmacy Antibiotic Note  Alvin Chase is a 70 y.o. male admitted on 12/10/2020 with recurrent L L2-3 herniated disc, lumbago, lumbar radiculopathy. Pt is S/P redo L L2-3 discectomy this afternoon.  Pharmacy has been consulted for vancomycin dosing for post-op surgical prophylaxis.  WBC 7.1, afebrile; Scr 1.35, CrCl 48.6 ml/min  Pt rec'd vancomycin 1 gm IV X 1 pre op at 11:25 PM today; per surgeon note, pt has no drains in place post op  Plan: Vancomycin 1 gm IV X 1 at 23:30 PM tonight (~12 hrs after pre-op dose)  Height: 5\' 8"  (172.7 cm) Weight: 81.6 kg (180 lb) IBW/kg (Calculated) : 68.4  Temp (24hrs), Avg:97.7 F (36.5 C), Min:97.5 F (36.4 C), Max:97.9 F (36.6 C)  Recent Labs  Lab 12/08/20 1352  WBC 7.1  CREATININE 1.35*    Estimated Creatinine Clearance: 48.6 mL/min (A) (by C-G formula based on SCr of 1.35 mg/dL (H)).    Allergies  Allergen Reactions  . Amoxicillin Rash  . Hydrocodone-Acetaminophen Rash  . Plaquenil  [Hydroxychloroquine Sulfate] Rash    Microbiology results: 5/2 MRSA PCR: negative 4/19 COVID: negative  Thank you for allowing pharmacy to be a part of this patient's care.  Gillermina Hu, PharmD, BCPS, [facility] Clinical Pharmacist 12/10/2020 6:28 PM

## 2020-12-10 NOTE — Anesthesia Postprocedure Evaluation (Signed)
Anesthesia Post Note  Patient: Alvin Chase  Procedure(s) Performed: MICRODISCECTOMY LEFT LUMBAR TWO- LUMBAR THREE (Left )     Patient location during evaluation: PACU Anesthesia Type: General Level of consciousness: awake and alert Pain management: pain level controlled Vital Signs Assessment: post-procedure vital signs reviewed and stable Respiratory status: spontaneous breathing, nonlabored ventilation, respiratory function stable and patient connected to nasal cannula oxygen Cardiovascular status: blood pressure returned to baseline and stable Postop Assessment: no apparent nausea or vomiting Anesthetic complications: no   No complications documented.  Last Vitals:  Vitals:   12/10/20 1757 12/10/20 1919  BP: (!) 151/72 130/65  Pulse: 64 70  Resp: 20 18  Temp: 36.4 C 36.7 C  SpO2: 99% 99%    Last Pain:  Vitals:   12/10/20 1920  TempSrc:   PainSc: 3                  Alvin Chase

## 2020-12-10 NOTE — H&P (Signed)
Subjective: The patient is a 70 year old white male on whom I performed back surgery.  He is developed recurrent back and leg pain consistent with a lumbar radiculopathy.  He was worked up with a lumbar MRI which demonstrated recurrent herniated disc at L2-3.  I discussed the various treatment options.  He has decided proceed with surgery.  Past Medical History:  Diagnosis Date  . A-fib [facility])    " once   brought on by [place] Disease " per daughter  . Cancer ([place])    skin  . Constipation   . GERD (gastroesophageal reflux disease)   . HOH (hard of hearing)   . Hyperlipemia   . Hypertension   . Hypothyroidism   . Lyme disease   . Meningitis   . Spondylosis of lumbar spine   . Spondylosis of lumbar spine   . TIA (transient ischemic attack)     Past Surgical History:  Procedure Laterality Date  . COLONOSCOPY W/ BIOPSIES AND POLYPECTOMY    . JOINT REPLACEMENT     B/L knees    Allergies  Allergen Reactions  . Amoxicillin Rash  . Hydrocodone-Acetaminophen Rash  . Plaquenil  [Hydroxychloroquine Sulfate] Rash    Social History   Tobacco Use  . Smoking status: Former Smoker    Types: Cigarettes  . Smokeless tobacco: Never Used  . Tobacco comment: quit at least 35 years ago  Substance Use Topics  . Alcohol use: Yes    Alcohol/week: 7.0 standard drinks    Types: 7 Cans of beer per week    Comment: occasional; during summer months only    History reviewed. No pertinent family history. Prior to Admission medications   Medication Sig Start Date End Date Taking? Authorizing Provider  acetaminophen (TYLENOL) 500 MG tablet Take 500 mg by mouth every 6 (six) hours as needed for moderate pain.   Yes [provider]  Cholecalciferol 1.25 MG (50000 UT) capsule Take 50,000 Units by mouth once a week.   Yes [provider]  gabapentin (NEURONTIN) 300 MG capsule Take 600 mg by mouth at bedtime. Additional 300 mg during the day if need   Yes [provider]   levothyroxine (SYNTHROID, LEVOTHROID) 100 MCG tablet Take 100 mcg by mouth daily before breakfast.    Yes [provider]  omeprazole (PRILOSEC) 20 MG capsule Take 20 mg by mouth at bedtime as needed.   Yes [provider]  pravastatin (PRAVACHOL) 20 MG tablet Take 20 mg by mouth at bedtime. 10/11/20  Yes [provider]  ibuprofen (ADVIL) 200 MG tablet Take 200 mg by mouth every 6 (six) hours as needed for moderate pain or mild pain.    [provider]     Review of Systems  Positive ROS: As above  All other systems have been reviewed and were otherwise negative with the exception of those mentioned in the HPI and as above.  Objective: Vital signs in last 24 hours: Temp:  [97.9 F (36.6 C)] 97.9 F (36.6 C) (05/04 1100) Pulse Rate:  [63] 63 (05/04 1100) Resp:  [17] 17 (05/04 1100) BP: (153)/(84) 153/84 (05/04 1100) SpO2:  [99 %] 99 % (05/04 1100) Weight:  [81.6 kg] 81.6 kg (05/04 1100) Estimated body mass index is 27.37 kg/m as calculated from the following:   Height as of this encounter: 5\' 8"  (1.727 m).   Weight as of this encounter: 81.6 kg.   General Appearance: Alert Head: Normocephalic, without obvious abnormality, atraumatic Eyes: PERRL, conjunctiva/corneas clear,  EOM's intact,    Ears: Normal  Throat: Normal  Neck: Supple, Back: His lumbar incision is well-healed. Lungs: Clear to auscultation bilaterally, respirations unlabored Heart: Regular rate and rhythm, no murmur, rub or gallop Abdomen: Soft, non-tender Extremities: Extremities normal, atraumatic, no cyanosis or edema Skin: unremarkable  NEUROLOGIC:   Mental status: alert and oriented,Motor Exam - grossly normal Sensory Exam - grossly normal Reflexes:  Coordination - grossly normal Gait - grossly normal Balance - grossly normal Cranial Nerves: I: smell Not tested  II: visual acuity  OS: Normal  OD: Normal   II: visual fields Full to confrontation  II: pupils  Equal, round, reactive to light  III,VII: ptosis None  III,IV,VI: extraocular muscles  Full ROM  V: mastication Normal  V: facial light touch sensation  Normal  V,VII: corneal reflex  Present  VII: facial muscle function - upper  Normal  VII: facial muscle function - lower Normal  VIII: hearing Not tested  IX: soft palate elevation  Normal  IX,X: gag reflex Present  XI: trapezius strength  5/5  XI: sternocleidomastoid strength 5/5  XI: neck flexion strength  5/5  XII: tongue strength  Normal    Data Review Lab Results  Component Value Date   WBC 7.1 12/08/2020   HGB 14.6 12/08/2020   HCT 44.9 12/08/2020   MCV 94.3 12/08/2020   PLT 288 12/08/2020   Lab Results  Component Value Date   NA 136 12/08/2020   K 4.1 12/08/2020   CL 104 12/08/2020   CO2 26 12/08/2020   BUN 17 12/08/2020   CREATININE 1.35 (H) 12/08/2020   GLUCOSE 97 12/08/2020   No results found for: INR, PROTIME  Assessment/Plan: Left L2-3 herniated disc, lumbar radiculopathy, lumbago: I have discussed the situation with the patient.  I reviewed his imaging studies with him and pointed out the abnormalities.  We have discussed the various treatment options including surgery.  I have described the surgical treatment option of a redo left L2-3 discectomy.  I have shown him surgical models.  I have given him a surgical pamphlet.  We have discussed the risk, benefits, alternatives, expected postoperative course, and likelihood of achieving our goals with surgery.  I have answered all his questions.  He has decided proceed with surgery.   Ophelia Charter 12/10/2020 1:18 PM

## 2020-12-10 NOTE — Anesthesia Procedure Notes (Signed)
Procedure Name: Intubation Date/Time: 12/10/2020 2:03 PM Performed by: Thelma Comp, CRNA Pre-anesthesia Checklist: Patient identified, Emergency Drugs available, Suction available and Patient being monitored Patient Re-evaluated:Patient Re-evaluated prior to induction Oxygen Delivery Method: Circle System Utilized Preoxygenation: Pre-oxygenation with 100% oxygen Induction Type: IV induction Ventilation: Mask ventilation without difficulty Laryngoscope Size: Mac and 4 Grade View: Grade II Tube type: Oral Tube size: 7.5 mm Number of attempts: 1 Airway Equipment and Method: Stylet Placement Confirmation: ETT inserted through vocal cords under direct vision,  positive ETCO2 and breath sounds checked- equal and bilateral Secured at: 23 cm Tube secured with: Tape Dental Injury: Teeth and Oropharynx as per pre-operative assessment

## 2020-12-11 ENCOUNTER — Encounter (HOSPITAL_COMMUNITY): Payer: Self-pay | Admitting: Neurosurgery

## 2020-12-11 DIAGNOSIS — M5116 Intervertebral disc disorders with radiculopathy, lumbar region: Secondary | ICD-10-CM | POA: Diagnosis not present

## 2020-12-11 MED ORDER — HYDROMORPHONE HCL 2 MG PO TABS: 2.0000 mg | ORAL_TABLET | ORAL | 0 refills | Status: AC | PRN

## 2020-12-11 MED ORDER — DOCUSATE SODIUM 100 MG PO CAPS: 100.0000 mg | ORAL_CAPSULE | Freq: Two times a day (BID) | ORAL | 0 refills | Status: AC

## 2020-12-11 NOTE — Discharge Summary (Signed)
Physician Discharge Summary  Patient ID: Alvin Chase MRN: [id number] DOB/AGE: 1951/06/11 70 y.o.  Admit date: 12/10/2020 Discharge date: 12/11/2020  Admission Diagnoses: Recurrent lumbar herniated disc, lumbar radiculopathy, lumbago Discharge Diagnoses: The same Active Problems:   Recurrent herniation of lumbar disc   Discharged Condition: good  Hospital Course: I performed a redo left L2-3 discectomy on the patient on 12/10/2020.  The surgery went well.  The patient's postoperative course was unremarkable.  On postoperative day #1 he requested discharge home.  He was given written and oral discharge instructions.  All his questions were answered.  Consults: PT, OT, care management Significant Diagnostic Studies: None Treatments: Redo left L4-3 discectomy using microdissection Discharge Exam: Blood pressure 139/64, pulse 78, temperature 97.6 F (36.4 C), temperature source Oral, resp. rate 16, height 5\' 8"  (1.727 m), weight 81.6 kg, SpO2 99 %. The patient is alert and pleasant.  His strength is normal.  He looks well.  His dressing is clean and dry.  Disposition: Home  Discharge Instructions    Call MD for:  difficulty breathing, headache or visual disturbances   Complete by: As directed    Call MD for:  extreme fatigue   Complete by: As directed    Call MD for:  hives   Complete by: As directed    Call MD for:  persistant dizziness or light-headedness   Complete by: As directed    Call MD for:  persistant nausea and vomiting   Complete by: As directed    Call MD for:  redness, tenderness, or signs of infection (pain, swelling, redness, odor or green/yellow discharge around incision site)   Complete by: As directed    Call MD for:  severe uncontrolled pain   Complete by: As directed    Call MD for:  temperature >100.4   Complete by: As directed    Diet - low sodium heart healthy   Complete by: As directed    Discharge instructions   Complete by: As directed    Call  [phone] for a followup appointment. Take a stool softener while you are using pain medications.   Driving Restrictions   Complete by: As directed    Do not drive for 2 weeks.   Increase activity slowly   Complete by: As directed    Lifting restrictions   Complete by: As directed    Do not lift more than 5 pounds. No excessive bending or twisting.   May shower / Bathe   Complete by: As directed    Remove the dressing for 3 days after surgery.  You may shower, but leave the incision alone.   Remove dressing in 48 hours   Complete by: As directed      Allergies as of 12/11/2020      Reactions   Amoxicillin Rash   Hydrocodone-acetaminophen Rash   Plaquenil  [hydroxychloroquine Sulfate] Rash      Medication List    TAKE these medications   acetaminophen 500 MG tablet Commonly known as: TYLENOL Take 500 mg by mouth every 6 (six) hours as needed for moderate pain.   Cholecalciferol 1.25 MG (50000 UT) capsule Take 50,000 Units by mouth once a week.   docusate sodium 100 MG capsule Commonly known as: COLACE Take 1 capsule (100 mg total) by mouth 2 (two) times daily.   gabapentin 300 MG capsule Commonly known as: NEURONTIN Take 600 mg by mouth at bedtime. Additional 300 mg during the day if need   HYDROmorphone 2 MG  tablet Commonly known as: DILAUDID Take 1-2 tablets (2-4 mg total) by mouth every 4 (four) hours as needed for severe pain.   ibuprofen 200 MG tablet Commonly known as: ADVIL Take 200 mg by mouth every 6 (six) hours as needed for moderate pain or mild pain.   levothyroxine 100 MCG tablet Commonly known as: SYNTHROID Take 100 mcg by mouth daily before breakfast.   omeprazole 20 MG capsule Commonly known as: PRILOSEC Take 20 mg by mouth at bedtime as needed.   pravastatin 20 MG tablet Commonly known as: PRAVACHOL Take 20 mg by mouth at bedtime.        Signed: Ophelia Charter 12/11/2020, 9:44 AM

## 2020-12-11 NOTE — Evaluation (Signed)
Physical Therapy Evaluation and Discharge Patient Details Name: Alvin Chase MRN: [id number] DOB: [birth year] Today's Date: 12/11/2020   History of Present Illness  Pt is a 70 y/o male who underwent L2-3 intervertebral discectomy using micro-dissection on 12/10/20 .PMH significant for HOH, Hypertension, Hypothyroidism, Lyme Disease.    Clinical Impression  Patient evaluated by Physical Therapy with no further acute PT needs identified. All education has been completed and the patient has no further questions. Pt was able to demonstrate transfers and ambulation with gross modified independence to independence and no AD. Pt was educated on precautions, positioning recommendations, appropriate activity progression, and car transfer. See below for any follow-up Physical Therapy or equipment needs. PT is signing off. Thank you for this referral.     Follow Up Recommendations No PT follow up;Supervision - Intermittent    Equipment Recommendations  None recommended by PT    Recommendations for Other Services       Precautions / Restrictions Precautions Precautions: Back;Fall Precaution Booklet Issued: Yes (comment) Precaution Comments: Reviewed handout and pt was cued for precautions during functional mobility. Required Braces or Orthoses:  ("no brace needed" order) Restrictions Weight Bearing Restrictions: No Other Position/Activity Restrictions: No brace needed.      Mobility  Bed Mobility Overal bed mobility: Modified Independent             General bed mobility comments: Pt was received sitting up EOB.    Transfers Overall transfer level: Independent Equipment used: None                Ambulation/Gait Ambulation/Gait assistance: Modified independent (Device/Increase time) Gait Distance (Feet): 400 Feet Assistive device: None Gait Pattern/deviations: WFL(Within Functional Limits)   Gait velocity interpretation: >2.62 ft/sec, indicative of community  ambulatory General Gait Details: Mildly slowed gait speed however still WFL.  Stairs Stairs:  (Pt declined stair training - only has 1 to negotiate.)          Wheelchair Mobility    Modified Rankin (Stroke Patients Only)       Balance Overall balance assessment: No apparent balance deficits (not formally assessed)                                           Pertinent Vitals/Pain Pain Assessment: 0-10 Pain Score: 2  Pain Location: Incisional Pain Descriptors / Indicators: Tender Pain Intervention(s): Limited activity within patient's tolerance;Monitored during session;Repositioned    Home Living Family/patient expects to be discharged to:: Private residence Living Arrangements: Spouse/significant other Available Help at Discharge: Family;Available 24 hours/day Type of Home: House Home Access: Stairs to enter   [employer] of Steps: 1 Home Layout: One level Home Equipment: Walker - 2 wheels;Shower seat;Grab bars - tub/shower      Prior Function Level of Independence: Independent         Comments: Retired, worked in Therapist, 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    Extremity/Trunk Assessment   Upper Extremity Assessment Upper Extremity Assessment: Overall WFL for tasks assessed    Lower Extremity Assessment Lower Extremity Assessment: Overall WFL for tasks assessed    Cervical / Trunk Assessment Cervical / Trunk Assessment: Other exceptions Cervical / Trunk Exceptions: s/p surgery  Communication   Communication: HOH  Cognition Arousal/Alertness: Awake/alert Behavior During Therapy: WFL for tasks assessed/performed Overall Cognitive Status: Within Functional  Limits for tasks assessed                                        General Comments      Exercises     Assessment/Plan    PT Assessment Patent does not need any further PT services  PT Problem  List         PT Treatment Interventions      PT Goals (Current goals can be found in the Care Plan section)  Acute Rehab PT Goals Patient Stated Goal: Be able to manage his vegetable garden PT Goal Formulation: All assessment and education complete, DC therapy    Frequency     Barriers to discharge        Co-evaluation               AM-PAC PT "6 Clicks" Mobility  Outcome Measure Help needed turning from your back to your side while in a flat bed without using bedrails?: None Help needed moving from lying on your back to sitting on the side of a flat bed without using bedrails?: None Help needed moving to and from a bed to a chair (including a wheelchair)?: None Help needed standing up from a chair using your arms (e.g., wheelchair or bedside chair)?: None Help needed to walk in hospital room?: None Help needed climbing 3-5 steps with a railing? : None 6 Click Score: 24    End of Session   Activity Tolerance: Patient tolerated treatment well Patient left: with call bell/phone within reach (Sitting EOB) Nurse Communication: Mobility status PT Visit Diagnosis: Pain Pain - part of body:  (back)    Time: 9563-8756 PT Time Calculation (min) (ACUTE ONLY): 10 min   Charges:   PT Evaluation $PT Eval Low Complexity: 1 Low          Rolinda Roan, PT, DPT Acute Rehabilitation Services Pager: [phone] Office: [phone]   Thelma Comp 12/11/2020, 10:30 AM

## 2020-12-11 NOTE — Evaluation (Signed)
Occupational Therapy Evaluation Patient Details Name: Alvin Chase MRN: [id number] DOB: 1951/07/19 Today's Date: 12/11/2020    History of Present Illness 70 year old white male with recurrent herniated disc at L2-3.  PMH includes: Constipation, GERD, HOH, Hyperlipemia, Hypertension, Hypothyroidism, Lyme Disease.  Patient underwent L2-3 intervertebral discectomy using micro-dissection on 5/4.   Clinical Impression   Patient was admitted for the diagnosis and procedure above.  PTA he lives with his spouse, who is able to assist as needed.  The patient is experiencing minimal pain at the incision site, and is not having any deficits related to ADL or mobility.  The patient preferred not to get dressed until the MD looked at his incision, but no assist is anticipated.  No acute or post acute OT needs have been identified.      Follow Up Recommendations  No OT follow up    Equipment Recommendations  None recommended by OT    Recommendations for Other Services       Precautions / Restrictions Precautions Precautions: Back Precaution Booklet Issued: Yes (comment) Precaution Comments: Reviewed Restrictions Weight Bearing Restrictions: No Other Position/Activity Restrictions: No brace needed.      Mobility Bed Mobility Overal bed mobility: Modified Independent               Patient Response: Cooperative  Transfers Overall transfer level: Modified independent                    Balance Overall balance assessment: No apparent balance deficits (not formally assessed)                                         ADL either performed or assessed with clinical judgement   ADL Overall ADL's : At baseline                                       General ADL Comments: Patient is able to complete ADL from modified sit/stand level.     Vision Baseline Vision/History: No visual deficits Patient Visual Report: No change from baseline        Perception     Praxis      Pertinent Vitals/Pain Pain Assessment: 0-10 Pain Score: 2  Pain Location: Incisional Pain Descriptors / Indicators: Tender Pain Intervention(s): Monitored during session     Hand Dominance Right   Extremity/Trunk Assessment Upper Extremity Assessment Upper Extremity Assessment: Overall WFL for tasks assessed   Lower Extremity Assessment Lower Extremity Assessment: Defer to PT evaluation   Cervical / Trunk Assessment Cervical / Trunk Assessment: Normal (spinal surgery)   Communication Communication Communication: HOH   Cognition Arousal/Alertness: Awake/alert Behavior During Therapy: WFL for tasks assessed/performed Overall Cognitive Status: Within Functional Limits for tasks assessed                                                      Home Living Family/patient expects to be discharged to:: Private residence Living Arrangements: Spouse/significant other Available Help at Discharge: Family;Available 24 hours/day Type of Home: House Home Access: Stairs to enter [employer] of Steps: 1   Home Layout: One level     Bathroom  Shower/Tub: Occupational psychologist: Standard     Home Equipment: Environmental consultant - 2 wheels;Shower seat;Grab bars - tub/shower          Prior Functioning/Environment Level of Independence: Independent                 OT Problem List: Pain      OT Treatment/Interventions:      OT Goals(Current goals can be found in the care plan section) Acute Rehab OT Goals Patient Stated Goal: Hoping to see the MD so he can return home OT Goal Formulation: With patient Time For Goal Achievement: 12/11/20 Potential to Achieve Goals: Good  OT Frequency:     Barriers to D/C:  none noted          Co-evaluation              AM-PAC OT "6 Clicks" Daily Activity     Outcome Measure Help from another person eating meals?: None Help from another person taking care of  personal grooming?: None Help from another person toileting, which includes using toliet, bedpan, or urinal?: None Help from another person bathing (including washing, rinsing, drying)?: None Help from another person to put on and taking off regular upper body clothing?: None Help from another person to put on and taking off regular lower body clothing?: None 6 Click Score: 24   End of Session    Activity Tolerance: Patient tolerated treatment well Patient left: in bed;with call bell/phone within reach  OT Visit Diagnosis: Pain Pain - Right/Left:  (spine)                Time: 9357-0177 OT Time Calculation (min): 19 min Charges:  OT General Charges $OT Visit: 1 Visit OT Evaluation $OT Eval Moderate Complexity: 1 Mod  12/11/2020  Rich, OTR/L  Acute Rehabilitation Services  Office:  [phone]   Metta Clines 12/11/2020, 8:55 AM

## 2020-12-11 NOTE — Progress Notes (Signed)
Patient walked by himself to be transported for discharge home; in no acute distress nor complaints of pain nor discomfort; all belongings checked and carried along with him; discharge instructions given by RN and he verbalized understanding on instructions given.

## 2021-12-29 IMAGING — CR DG LUMBAR SPINE 2-3V
3 series · 3 of 3 positions shown · non-contrast
Comparison: Plain films lumbar spine 05/13/2020.

CLINICAL DATA: Localization films for L2-3 micro discectomy.

EXAM:
LUMBAR SPINE - 2-3 VIEW

[xtable lateral (1 of 3)]
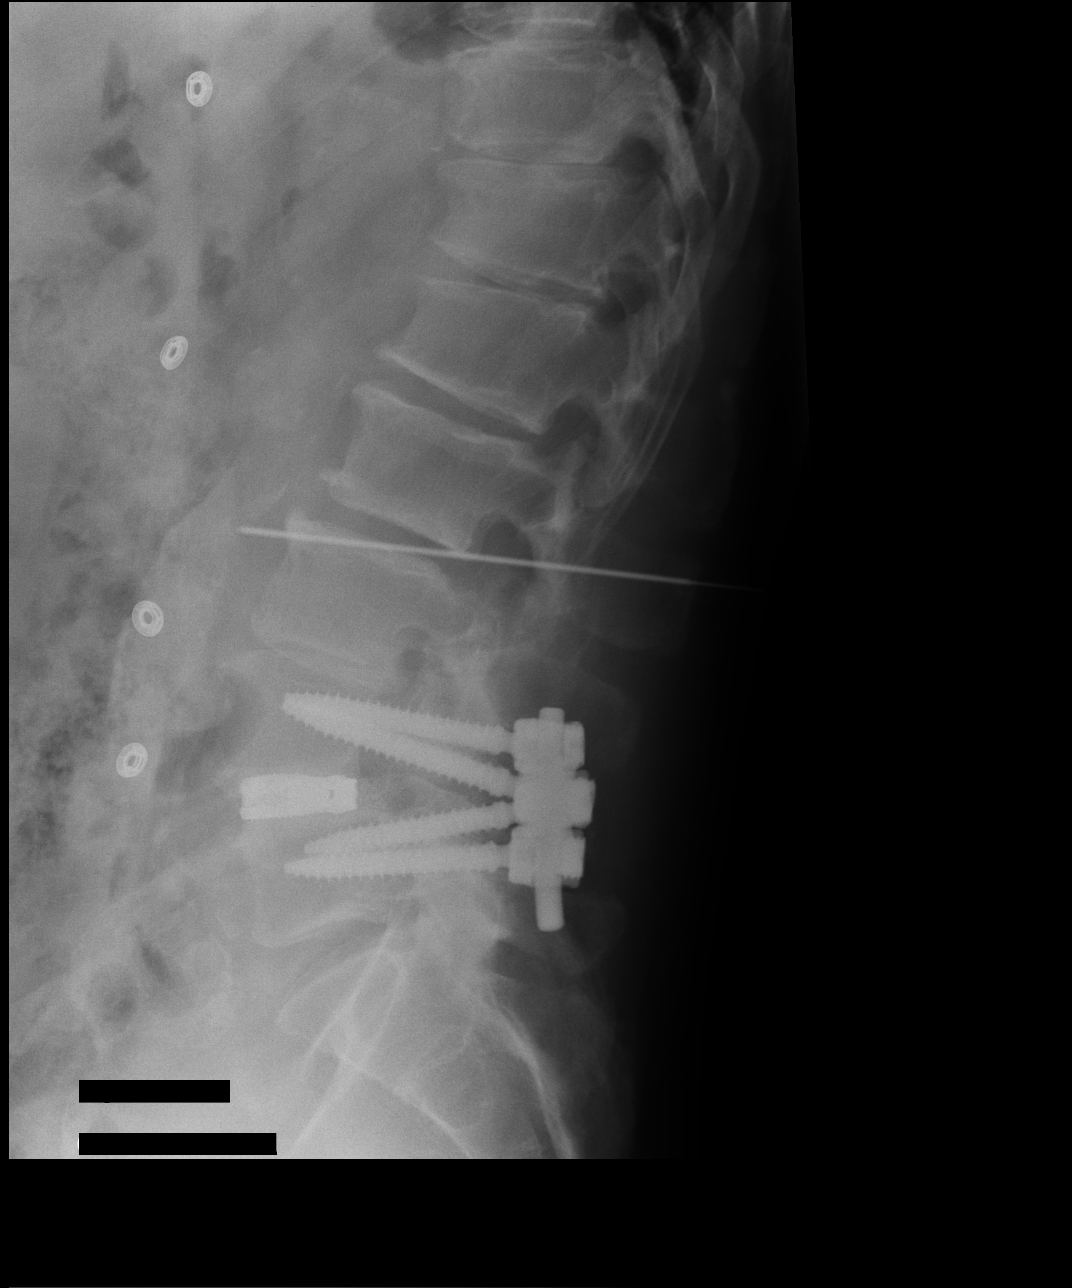

[xtable lateral (2 of 3)]
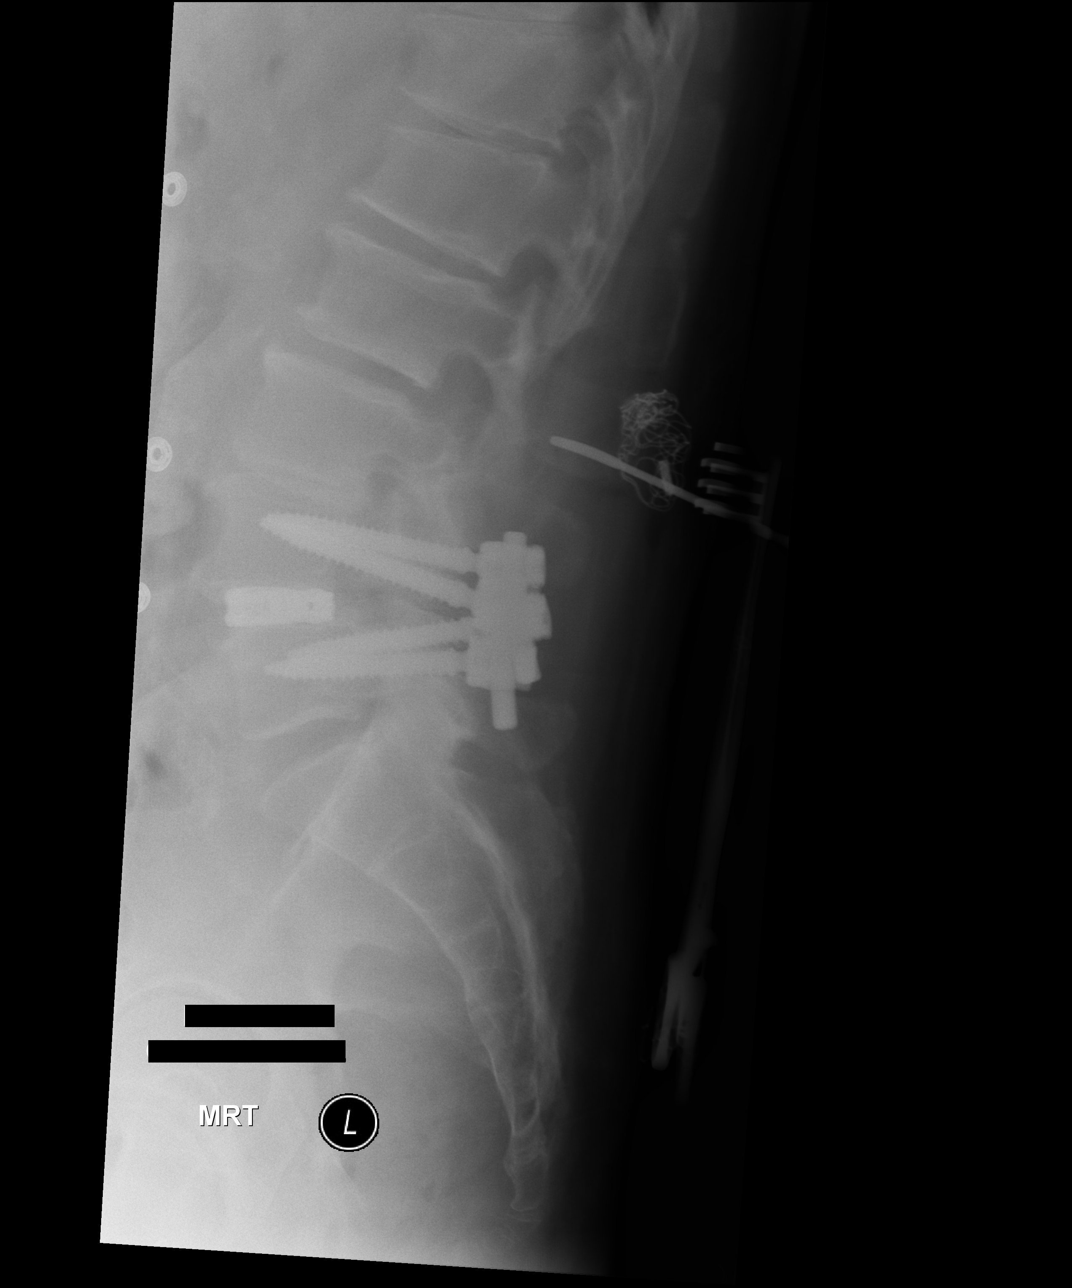

[xtable lateral (3 of 3)]
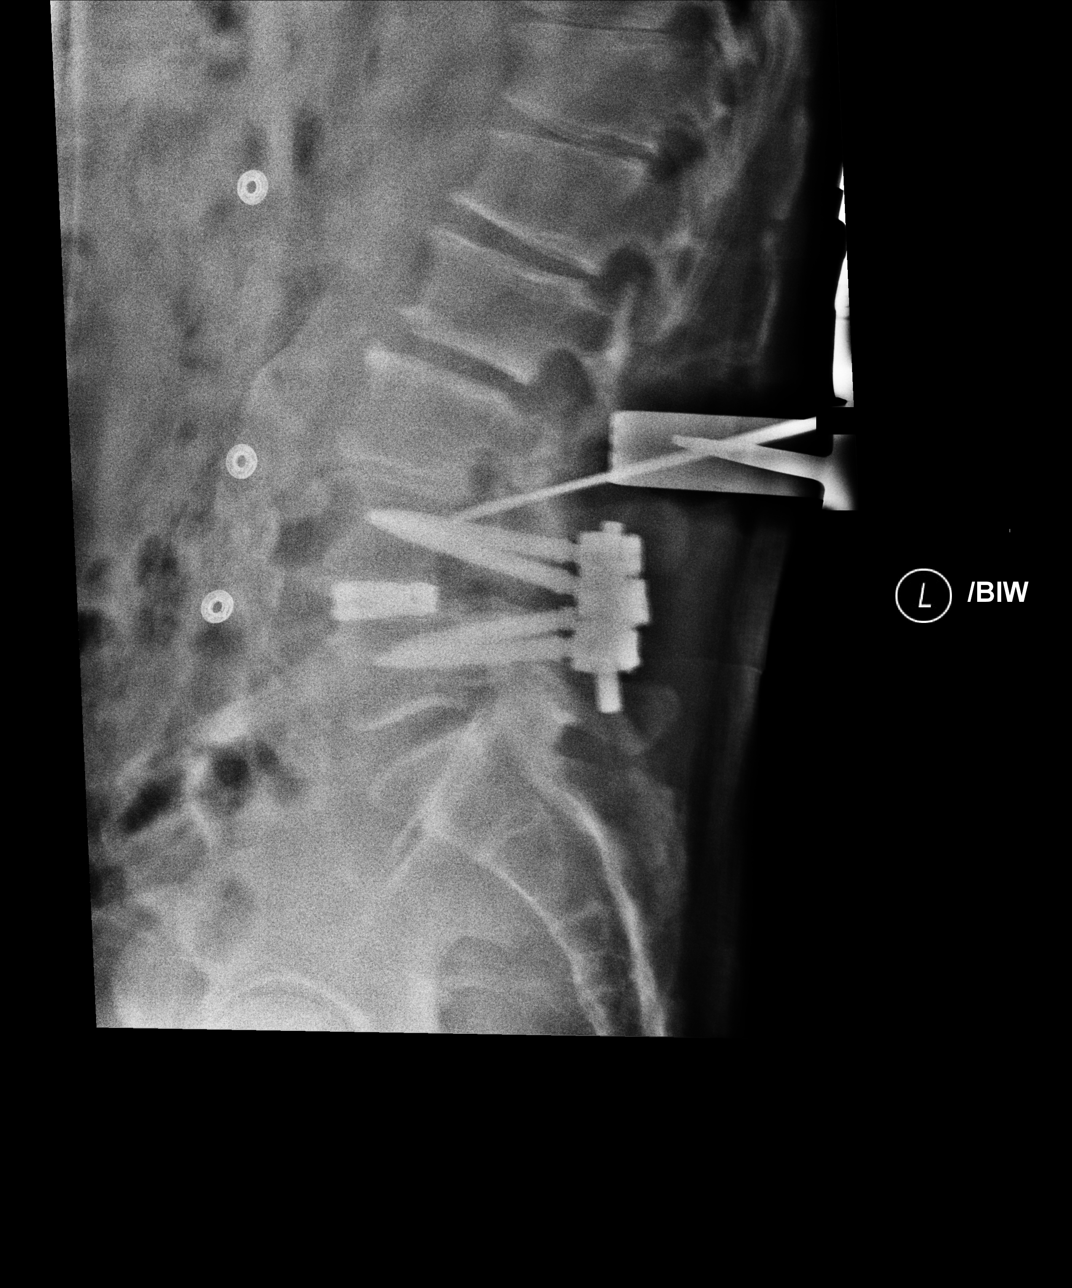

[3 of 3 positions shown; findings below may reference images not displayed]

FINDINGS: On the first image, a metallic probe projects beyond the L2-3
interspace and over posterior aorta. On the second image, probe in
the subcutaneous tissues of the back is directed toward the L2-3
interspace. On the third image, there is a clamp on the L3 spinous
process. A probe is directed toward L2-3. A second metallic device
overlies L4.
IMPRESSION: Localization as above.
